# Patient Record
Sex: Male | Born: 1955
Health system: Southern US, Community
[De-identification: ages and names within clinical notes are randomized; demographics above are authoritative.]

## PROBLEM LIST (undated history)

## (undated) DIAGNOSIS — E785 Hyperlipidemia, unspecified: Secondary | ICD-10-CM

## (undated) DIAGNOSIS — E079 Disorder of thyroid, unspecified: Secondary | ICD-10-CM

## (undated) DIAGNOSIS — T7840XA Allergy, unspecified, initial encounter: Secondary | ICD-10-CM

## (undated) DIAGNOSIS — K4090 Unilateral inguinal hernia, without obstruction or gangrene, not specified as recurrent: Secondary | ICD-10-CM

## (undated) DIAGNOSIS — E039 Hypothyroidism, unspecified: Secondary | ICD-10-CM

## (undated) DIAGNOSIS — K219 Gastro-esophageal reflux disease without esophagitis: Secondary | ICD-10-CM

## (undated) HISTORY — DX: Allergy, unspecified, initial encounter: T78.40XA

## (undated) HISTORY — DX: Unilateral inguinal hernia, without obstruction or gangrene, not specified as recurrent: K40.90

## (undated) HISTORY — PX: COLONOSCOPY: SHX174

---

## 2003-12-17 ENCOUNTER — Encounter: Admission: RE | Admit: 2003-12-17 | Discharge: 2004-03-16 | Payer: Self-pay | Admitting: Family Medicine

## 2004-02-16 ENCOUNTER — Ambulatory Visit: Payer: Self-pay | Admitting: Gastroenterology

## 2004-03-08 ENCOUNTER — Other Ambulatory Visit: Admission: RE | Admit: 2004-03-08 | Discharge: 2004-03-08 | Payer: Self-pay | Admitting: Diagnostic Radiology

## 2004-03-14 ENCOUNTER — Ambulatory Visit: Payer: Self-pay | Admitting: Gastroenterology

## 2009-02-08 ENCOUNTER — Encounter: Admission: RE | Admit: 2009-02-08 | Discharge: 2009-02-08 | Payer: Self-pay | Admitting: Family Medicine

## 2010-01-23 ENCOUNTER — Encounter: Admission: RE | Admit: 2010-01-23 | Discharge: 2010-01-23 | Payer: Self-pay | Admitting: Family Medicine

## 2011-02-27 ENCOUNTER — Encounter: Payer: Self-pay | Admitting: Gastroenterology

## 2011-03-01 ENCOUNTER — Other Ambulatory Visit: Payer: Self-pay | Admitting: Family Medicine

## 2011-03-01 DIAGNOSIS — E039 Hypothyroidism, unspecified: Secondary | ICD-10-CM

## 2011-03-07 ENCOUNTER — Ambulatory Visit
Admission: RE | Admit: 2011-03-07 | Discharge: 2011-03-07 | Disposition: A | Discharge status: Home / Self Care | Payer: Managed Care, Other (non HMO) | Source: Ambulatory Visit | Attending: Family Medicine | Admitting: Family Medicine

## 2011-03-07 DIAGNOSIS — E039 Hypothyroidism, unspecified: Secondary | ICD-10-CM

## 2011-12-10 ENCOUNTER — Encounter: Payer: Self-pay | Admitting: Gastroenterology

## 2013-03-18 ENCOUNTER — Other Ambulatory Visit: Payer: Self-pay | Admitting: Family Medicine

## 2013-03-18 DIAGNOSIS — E039 Hypothyroidism, unspecified: Secondary | ICD-10-CM

## 2013-03-19 ENCOUNTER — Ambulatory Visit
Admission: RE | Admit: 2013-03-19 | Discharge: 2013-03-19 | Disposition: A | Discharge status: Home / Self Care | Payer: Managed Care, Other (non HMO) | Source: Ambulatory Visit | Attending: Family Medicine | Admitting: Family Medicine

## 2013-03-19 DIAGNOSIS — E039 Hypothyroidism, unspecified: Secondary | ICD-10-CM

## 2014-08-18 ENCOUNTER — Emergency Department (HOSPITAL_COMMUNITY): Payer: BLUE CROSS/BLUE SHIELD

## 2014-08-18 ENCOUNTER — Encounter (HOSPITAL_COMMUNITY): Payer: Self-pay | Admitting: *Deleted

## 2014-08-18 ENCOUNTER — Inpatient Hospital Stay (HOSPITAL_COMMUNITY)
Admission: EM | Admit: 2014-08-18 | Discharge: 2014-08-26 | DRG: 184 | Disposition: A | Discharge status: Home / Self Care | Payer: BLUE CROSS/BLUE SHIELD | Attending: General Surgery | Admitting: General Surgery

## 2014-08-18 DIAGNOSIS — S2221XA Fracture of manubrium, initial encounter for closed fracture: Secondary | ICD-10-CM | POA: Diagnosis not present

## 2014-08-18 DIAGNOSIS — R140 Abdominal distension (gaseous): Secondary | ICD-10-CM

## 2014-08-18 DIAGNOSIS — Y92007 Garden or yard of unspecified non-institutional (private) residence as the place of occurrence of the external cause: Secondary | ICD-10-CM

## 2014-08-18 DIAGNOSIS — R55 Syncope and collapse: Secondary | ICD-10-CM

## 2014-08-18 DIAGNOSIS — D62 Acute posthemorrhagic anemia: Secondary | ICD-10-CM | POA: Diagnosis not present

## 2014-08-18 DIAGNOSIS — E785 Hyperlipidemia, unspecified: Secondary | ICD-10-CM | POA: Diagnosis present

## 2014-08-18 DIAGNOSIS — W19XXXA Unspecified fall, initial encounter: Secondary | ICD-10-CM

## 2014-08-18 DIAGNOSIS — Z79899 Other long term (current) drug therapy: Secondary | ICD-10-CM

## 2014-08-18 DIAGNOSIS — M79604 Pain in right leg: Secondary | ICD-10-CM

## 2014-08-18 DIAGNOSIS — S8011XA Contusion of right lower leg, initial encounter: Secondary | ICD-10-CM

## 2014-08-18 DIAGNOSIS — Z791 Long term (current) use of non-steroidal anti-inflammatories (NSAID): Secondary | ICD-10-CM

## 2014-08-18 DIAGNOSIS — K219 Gastro-esophageal reflux disease without esophagitis: Secondary | ICD-10-CM | POA: Diagnosis present

## 2014-08-18 DIAGNOSIS — S7010XA Contusion of unspecified thigh, initial encounter: Secondary | ICD-10-CM | POA: Diagnosis present

## 2014-08-18 DIAGNOSIS — S2231XA Fracture of one rib, right side, initial encounter for closed fracture: Secondary | ICD-10-CM

## 2014-08-18 DIAGNOSIS — T07XXXA Unspecified multiple injuries, initial encounter: Secondary | ICD-10-CM

## 2014-08-18 DIAGNOSIS — S2220XA Unspecified fracture of sternum, initial encounter for closed fracture: Secondary | ICD-10-CM | POA: Diagnosis present

## 2014-08-18 DIAGNOSIS — S5001XA Contusion of right elbow, initial encounter: Secondary | ICD-10-CM

## 2014-08-18 DIAGNOSIS — M79651 Pain in right thigh: Secondary | ICD-10-CM | POA: Diagnosis present

## 2014-08-18 DIAGNOSIS — W11XXXA Fall on and from ladder, initial encounter: Secondary | ICD-10-CM | POA: Diagnosis present

## 2014-08-18 DIAGNOSIS — S50311A Abrasion of right elbow, initial encounter: Secondary | ICD-10-CM

## 2014-08-18 DIAGNOSIS — S70311A Abrasion, right thigh, initial encounter: Secondary | ICD-10-CM | POA: Diagnosis present

## 2014-08-18 DIAGNOSIS — S27892A Contusion of other specified intrathoracic organs, initial encounter: Secondary | ICD-10-CM | POA: Diagnosis present

## 2014-08-18 DIAGNOSIS — K567 Ileus, unspecified: Secondary | ICD-10-CM

## 2014-08-18 HISTORY — DX: Disorder of thyroid, unspecified: E07.9

## 2014-08-18 HISTORY — DX: Hyperlipidemia, unspecified: E78.5

## 2014-08-18 HISTORY — DX: Gastro-esophageal reflux disease without esophagitis: K21.9

## 2014-08-18 LAB — COMPREHENSIVE METABOLIC PANEL
ALT: 25 U/L (ref 17–63)
AST: 34 U/L (ref 15–41)
Albumin: 3.8 g/dL (ref 3.5–5.0)
Alkaline Phosphatase: 44 U/L (ref 38–126)
Anion gap: 11 (ref 5–15)
BILIRUBIN TOTAL: 0.9 mg/dL (ref 0.3–1.2)
BUN: 13 mg/dL (ref 6–20)
CALCIUM: 8.6 mg/dL — AB (ref 8.9–10.3)
CO2: 20 mmol/L — AB (ref 22–32)
CREATININE: 0.97 mg/dL (ref 0.61–1.24)
Chloride: 106 mmol/L (ref 101–111)
GFR calc non Af Amer: >60 mL/min (ref >60)
GLUCOSE: 102 mg/dL — AB (ref 65–99)
POTASSIUM: 4 mmol/L (ref 3.5–5.1)
SODIUM: 137 mmol/L (ref 135–145)
Total Protein: 6.7 g/dL (ref 6.5–8.1)

## 2014-08-18 LAB — URINALYSIS, ROUTINE W REFLEX MICROSCOPIC
Bilirubin Urine: NEGATIVE
GLUCOSE, UA: NEGATIVE mg/dL
HGB URINE DIPSTICK: NEGATIVE
KETONES UR: 40 mg/dL — AB
Leukocytes, UA: NEGATIVE
NITRITE: NEGATIVE
PH: 5 (ref 5.0–8.0)
Protein, ur: NEGATIVE mg/dL
Specific Gravity, Urine: 1.028 (ref 1.005–1.030)
UROBILINOGEN UA: 0.2 mg/dL (ref 0.0–1.0)

## 2014-08-18 LAB — CBC WITH DIFFERENTIAL/PLATELET
BASOS ABS: 0 10*3/uL (ref 0.0–0.1)
BASOS PCT: 0 % (ref 0–1)
EOS ABS: 0 10*3/uL (ref 0.0–0.7)
Eosinophils Relative: 0 % (ref 0–5)
HCT: 43.1 % (ref 39.0–52.0)
Hemoglobin: 14.7 g/dL (ref 13.0–17.0)
LYMPHS ABS: 0.9 10*3/uL (ref 0.7–4.0)
Lymphocytes Relative: 8 % — ABNORMAL LOW (ref 12–46)
MCH: 31.1 pg (ref 26.0–34.0)
MCHC: 34.1 g/dL (ref 30.0–36.0)
MCV: 91.1 fL (ref 78.0–100.0)
MONOS PCT: 8 % (ref 3–12)
Monocytes Absolute: 0.9 10*3/uL (ref 0.1–1.0)
Neutro Abs: 9.1 10*3/uL — ABNORMAL HIGH (ref 1.7–7.7)
Neutrophils Relative %: 84 % — ABNORMAL HIGH (ref 43–77)
PLATELETS: 173 10*3/uL (ref 150–400)
RBC: 4.73 MIL/uL (ref 4.22–5.81)
RDW: 13.5 % (ref 11.5–15.5)
WBC: 10.9 10*3/uL — AB (ref 4.0–10.5)

## 2014-08-18 LAB — HEMOGLOBIN AND HEMATOCRIT, BLOOD
HCT: 40.4 % (ref 39.0–52.0)
HEMOGLOBIN: 13.4 g/dL (ref 13.0–17.0)

## 2014-08-18 MED ORDER — ONDANSETRON HCL 4 MG PO TABS: 4.0000 mg | ORAL_TABLET | Freq: Four times a day (QID) | ORAL | Status: DC | PRN

## 2014-08-18 MED ORDER — SIMVASTATIN 40 MG PO TABS
40.0000 mg | ORAL_TABLET | Freq: Every day | ORAL | Status: DC
  Administered 2014-08-19 – 2014-08-25 (×7): 40 mg via ORAL
  Filled 2014-08-18 (×7): qty 1

## 2014-08-18 MED ORDER — FENTANYL CITRATE (PF) 100 MCG/2ML IJ SOLN
100.0000 ug | INTRAMUSCULAR | Status: DC | PRN
  Administered 2014-08-18 (×2): 100 ug via INTRAVENOUS
  Filled 2014-08-18 (×2): qty 2

## 2014-08-18 MED ORDER — HYDROMORPHONE HCL 1 MG/ML IJ SOLN
0.5000 mg | INTRAMUSCULAR | Status: DC | PRN
  Administered 2014-08-18: 0.5 mg via INTRAVENOUS
  Administered 2014-08-19: 1 mg via INTRAVENOUS
  Filled 2014-08-18 (×2): qty 1

## 2014-08-18 MED ORDER — SODIUM CHLORIDE 0.9 % IV SOLN
INTRAVENOUS | Status: DC
  Administered 2014-08-18: 17:00:00 via INTRAVENOUS

## 2014-08-18 MED ORDER — SODIUM CHLORIDE 0.9 % IV SOLN
INTRAVENOUS | Status: DC
  Administered 2014-08-18 – 2014-08-19 (×2): via INTRAVENOUS

## 2014-08-18 MED ORDER — OXYCODONE HCL 5 MG PO TABS
10.0000 mg | ORAL_TABLET | ORAL | Status: DC | PRN
  Administered 2014-08-19: 10 mg via ORAL
  Filled 2014-08-18: qty 2

## 2014-08-18 MED ORDER — OXYCODONE HCL 5 MG PO TABS
5.0000 mg | ORAL_TABLET | ORAL | Status: DC | PRN
  Administered 2014-08-18 – 2014-08-19 (×2): 5 mg via ORAL
  Filled 2014-08-18 (×3): qty 1

## 2014-08-18 MED ORDER — ONDANSETRON HCL 4 MG/2ML IJ SOLN: 4.0000 mg | Freq: Four times a day (QID) | INTRAMUSCULAR | Status: DC | PRN

## 2014-08-18 MED ORDER — DOCUSATE SODIUM 100 MG PO CAPS
100.0000 mg | ORAL_CAPSULE | Freq: Two times a day (BID) | ORAL | Status: DC
  Administered 2014-08-18 – 2014-08-26 (×16): 100 mg via ORAL
  Filled 2014-08-18 (×18): qty 1

## 2014-08-18 MED ORDER — ONDANSETRON HCL 4 MG/2ML IJ SOLN
4.0000 mg | Freq: Once | INTRAMUSCULAR | Status: AC
  Administered 2014-08-18: 4 mg via INTRAVENOUS
  Filled 2014-08-18: qty 2

## 2014-08-18 MED ORDER — FENTANYL CITRATE (PF) 100 MCG/2ML IJ SOLN
50.0000 ug | INTRAMUSCULAR | Status: DC | PRN
  Administered 2014-08-18 (×2): 50 ug via INTRAVENOUS
  Filled 2014-08-18: qty 2

## 2014-08-18 MED ORDER — SODIUM CHLORIDE 0.9 % IV BOLUS (SEPSIS)
500.0000 mL | Freq: Once | INTRAVENOUS | Status: AC
  Administered 2014-08-18: 500 mL via INTRAVENOUS

## 2014-08-18 MED ORDER — BISACODYL 10 MG RE SUPP
10.0000 mg | Freq: Every day | RECTAL | Status: DC | PRN
  Administered 2014-08-21 – 2014-08-23 (×2): 10 mg via RECTAL
  Filled 2014-08-18 (×4): qty 1

## 2014-08-18 MED ORDER — LEVOTHYROXINE SODIUM 100 MCG PO TABS
100.0000 ug | ORAL_TABLET | Freq: Every day | ORAL | Status: DC
  Administered 2014-08-19 – 2014-08-26 (×8): 100 ug via ORAL
  Filled 2014-08-18 (×8): qty 1

## 2014-08-18 MED ORDER — TETANUS-DIPHTH-ACELL PERTUSSIS 5-2.5-18.5 LF-MCG/0.5 IM SUSP
0.5000 mL | Freq: Once | INTRAMUSCULAR | Status: AC
  Administered 2014-08-18: 0.5 mL via INTRAMUSCULAR
  Filled 2014-08-18: qty 0.5

## 2014-08-18 MED ORDER — IOHEXOL 300 MG/ML  SOLN
100.0000 mL | Freq: Once | INTRAMUSCULAR | Status: AC | PRN
  Administered 2014-08-18: 100 mL via INTRAVENOUS

## 2014-08-18 MED ORDER — PANTOPRAZOLE SODIUM 40 MG PO TBEC
40.0000 mg | DELAYED_RELEASE_TABLET | Freq: Every day | ORAL | Status: DC
  Administered 2014-08-19 – 2014-08-26 (×8): 40 mg via ORAL
  Filled 2014-08-18 (×9): qty 1

## 2014-08-18 NOTE — ED Notes (Signed)
Pt unable to urinate at this time, will re attempt in 30 mins.

## 2014-08-18 NOTE — ED Notes (Signed)
Family at bedside. 

## 2014-08-18 NOTE — ED Notes (Signed)
MD at bedside. 

## 2014-08-18 NOTE — ED Notes (Signed)
Patient transported to X-ray 

## 2014-08-18 NOTE — ED Notes (Signed)
Meal given per MD Hulen Skains.

## 2014-08-18 NOTE — ED Notes (Signed)
Pt presents via [facility] after falling 52ft off a ladder onto a AC unit.  R hip abrasions and r elbow abrasions noted, no obvious deformities.  Pt fully immobolized on arrival, c collar and back board.  GCS 15, A x 4, moves all extremities, no numbness or tingling.  C/o Right sided rib pain and right thigh pain.  Lung sounds clear and equal.  BP-98 palp, P-60 reg O2-100% RA.  A x 4, answers questions appropriately.  NAD.

## 2014-08-18 NOTE — ED Notes (Signed)
C-collar removed by MD Eulis Foster.

## 2014-08-18 NOTE — ED Notes (Signed)
Patient transported to CT 

## 2014-08-18 NOTE — ED Notes (Signed)
Attempted report x1. 

## 2014-08-18 NOTE — H&P (Signed)
History   Kenneth Leach is an 59 y.o. male.   Chief Complaint:  Chief Complaint  Patient presents with  . Fall    HPI Comments: Patient has no life-threatening injury.  Some mediastinal blood in the right anterior chest area likely associated with right 2nd rib fracture and maybe an occult manubrial fracture.  Fall Associated symptoms include chest pain.  Trauma Mechanism of injury: fall Injury location: torso Injury location detail: R chest Incident location: home Time since incident: 7 hours   Fall:      Fall occurred: from a ladder      Height of fall: 8 feet      Point of impact: front      Entrapped after fall: no  Protective equipment:       None      Suspicion of alcohol use: no      Suspicion of drug use: no  EMS/PTA data:      Bystander interventions: none      Ambulatory at scene: no      Blood loss: none      Responsiveness: alert      Oriented to: person, place, situation and time      Loss of consciousness: no      Amnesic to event: no      Airway interventions: none      Breathing interventions: none      IV access: established      IO access: none      Fluids administered: normal saline      Cardiac interventions: none      Medications administered: none      Immobilization: none      Airway condition since incident: stable      Breathing condition since incident: stable      Circulation condition since incident: stable      Mental status condition since incident: stable  Current symptoms:      Pain scale: 5/10      Pain quality: aching, heavy and sharp      Associated symptoms:            Reports chest pain.            Denies loss of consciousness.   Relevant PMH:      The patient has not been admitted to the hospital due to injury in the past year, and has not been treated and released from the ED due to injury in the past year.   Past Medical History  Diagnosis Date  . Thyroid disease   . Hyperlipidemia   . GERD (gastroesophageal  reflux disease)     History reviewed. No pertinent past surgical history.  No family history on file. Social History:  reports that he has never smoked. He does not have any smokeless tobacco history on file. He reports that he drinks alcohol. He reports that he does not use illicit drugs.  Allergies  No Known Allergies  Home Medications   (Not in a hospital admission)  Trauma Course   Results for orders placed or performed during the hospital encounter of 08/18/14 (from the past 48 hour(s))  Comprehensive metabolic panel     Status: Abnormal   Collection Time: 08/18/14  4:38 PM  Result Value Ref Range   Sodium 137 135 - 145 mmol/L   Potassium 4.0 3.5 - 5.1 mmol/L   Chloride 106 101 - 111 mmol/L   CO2 20 (L) 22 - 32 mmol/L   Glucose, Bld  102 (H) 65 - 99 mg/dL   BUN 13 6 - 20 mg/dL   Creatinine, Ser 0.97 0.61 - 1.24 mg/dL   Calcium 8.6 (L) 8.9 - 10.3 mg/dL   Total Protein 6.7 6.5 - 8.1 g/dL   Albumin 3.8 3.5 - 5.0 g/dL   AST 34 15 - 41 U/L   ALT 25 17 - 63 U/L   Alkaline Phosphatase 44 38 - 126 U/L   Total Bilirubin 0.9 0.3 - 1.2 mg/dL   GFR calc non Af Amer >60 >60 mL/min   GFR calc Af Amer >60 >60 mL/min    Comment: (NOTE) The eGFR has been calculated using the CKD EPI equation. This calculation has not been validated in all clinical situations. eGFR's persistently <60 mL/min signify possible Chronic Kidney Disease.    Anion gap 11 5 - 15  CBC with Differential     Status: Abnormal   Collection Time: 08/18/14  4:38 PM  Result Value Ref Range   WBC 10.9 (H) 4.0 - 10.5 K/uL   RBC 4.73 4.22 - 5.81 MIL/uL   Hemoglobin 14.7 13.0 - 17.0 g/dL   HCT 43.1 39.0 - 52.0 %   MCV 91.1 78.0 - 100.0 fL   MCH 31.1 26.0 - 34.0 pg   MCHC 34.1 30.0 - 36.0 g/dL   RDW 13.5 11.5 - 15.5 %   Platelets 173 150 - 400 K/uL   Neutrophils Relative % 84 (H) 43 - 77 %   Neutro Abs 9.1 (H) 1.7 - 7.7 K/uL   Lymphocytes Relative 8 (L) 12 - 46 %   Lymphs Abs 0.9 0.7 - 4.0 K/uL   Monocytes  Relative 8 3 - 12 %   Monocytes Absolute 0.9 0.1 - 1.0 K/uL   Eosinophils Relative 0 0 - 5 %   Eosinophils Absolute 0.0 0.0 - 0.7 K/uL   Basophils Relative 0 0 - 1 %   Basophils Absolute 0.0 0.0 - 0.1 K/uL  Urinalysis, Routine w reflex microscopic (not at [facility])     Status: Abnormal   Collection Time: 08/18/14  6:29 PM  Result Value Ref Range   Color, Urine YELLOW YELLOW   APPearance CLEAR CLEAR   Specific Gravity, Urine 1.028 1.005 - 1.030   pH 5.0 5.0 - 8.0   Glucose, UA NEGATIVE NEGATIVE mg/dL   Hgb urine dipstick NEGATIVE NEGATIVE   Bilirubin Urine NEGATIVE NEGATIVE   Ketones, ur 40 (A) NEGATIVE mg/dL   Protein, ur NEGATIVE NEGATIVE mg/dL   Urobilinogen, UA 0.2 0.0 - 1.0 mg/dL   Nitrite NEGATIVE NEGATIVE   Leukocytes, UA NEGATIVE NEGATIVE    Comment: MICROSCOPIC NOT DONE ON URINES WITH NEGATIVE PROTEIN, BLOOD, LEUKOCYTES, NITRITE, OR GLUCOSE <1000 mg/dL.   Dg Ribs Unilateral W/chest Right  08/18/2014   CLINICAL DATA:  [place] [address] 15 feet from ladder 2 hours ago.  EXAM: RIGHT RIBS AND CHEST - 3+ VIEW  COMPARISON:  None.  FINDINGS: There is cardiomegaly. Low lung volumes with bibasilar opacities, likely atelectasis. No effusions or pneumothorax. Prominence of the superior mediastinum with deviation of the trachea to the right. This could be related to substernal goiter or vasculature. Cannot exclude aortic aneurysm or other mediastinal mass.  There is a nondisplaced right lateral tenth rib fracture.  IMPRESSION: Nondisplaced right lateral tenth rib fracture. Low lung volumes with bibasilar atelectasis. No pneumothorax.  Cardiomegaly. Prominence of the superior mediastinum with deviation of the trachea to the right could be related to aortic aneurysm, substernal thyroid goiter, or other mediastinal  mass.   Electronically Signed   By: Rolm Baptise M.D.   On: 08/18/2014 12:46   Dg Elbow Complete Right  08/18/2014   CLINICAL DATA:  59 year old male fell 15 feet off of a ladder 2 hours ago with pain.  Initial encounter.  EXAM: RIGHT ELBOW - COMPLETE 3+ VIEW  COMPARISON:  None.  FINDINGS: IV access artifact. The lateral view is oblique. No right elbow joint effusion is evident. Joint spaces and alignment are within normal limits. The radial head appears intact. No acute fracture or dislocation identified.  IMPRESSION: No acute fracture or dislocation identified about the right elbow.   Electronically Signed   By: Genevie Ann M.D.   On: 08/18/2014 12:42   Ct Head Wo Contrast  08/18/2014   CLINICAL DATA:  Fall. 15 foot fall from ladder. Cervical Spine immobilized.  EXAM: CT HEAD WITHOUT CONTRAST  CT CERVICAL SPINE WITHOUT CONTRAST  TECHNIQUE: Multidetector CT imaging of the head and cervical spine was performed following the standard protocol without intravenous contrast. Multiplanar CT image reconstructions of the cervical spine were also generated.  COMPARISON:  None.  FINDINGS: CT HEAD FINDINGS  No mass lesion, mass effect, midline shift, hydrocephalus, hemorrhage. No territorial ischemia or acute infarction. The calvarium is intact. Tiny RIGHT mastoid effusion is present. Skull base appears intact. Visible paranasal sinuses are normal.  CT CERVICAL SPINE FINDINGS  Alignment: Normal.  Craniocervical junction: Odontoid intact. Occipital condyles are normal. The C1 ring is normal.  Vertebrae: Negative for fracture. Degenerative endplate changes from C5 through C7.  Paraspinal soft tissues: Normal.  Lung apices: Ground-glass attenuation in the lung apices. Given the distribution, this may represent edema or atelectasis. Correlating with the chest radiograph, this probably represents edema given the cardiomegaly.  Severe disc degeneration at C5-C6 and C6-C7 with endplate sclerosis and uncovertebral spurring. Foraminal stenosis is most pronounced at C5-C6.  There is a small cortical defect in the anterior sternal manubrium on the axial images. This does not extend through the posterior cortex and probably represents a  vascular channel rather than sternal fracture. Clinically correlate for tenderness of the sternal manubrium.  IMPRESSION: 1. Negative CT head. 2. Moderate mid to lower cervical spondylosis. No cervical spine fracture or dislocation. 3. Ground-glass attenuation at the lung apices likely representing combination of atelectasis and edema. 4. Tiny anterior cortical lucency in the sternal manubrium, extending from RIGHT to LEFT as it extends superiorly. Vascular channel favored over fracture. Correlate for tenderness.   Electronically Signed   By: Dereck Ligas M.D.   On: 08/18/2014 13:30   Ct Chest W Contrast  08/18/2014   CLINICAL DATA:  15 foot fall from ladder onto air conditioner. Right hip abrasion.  EXAM: CT CHEST, ABDOMEN, AND PELVIS WITH CONTRAST  TECHNIQUE: Multidetector CT imaging of the chest, abdomen and pelvis was performed following the standard protocol during bolus administration of intravenous contrast.  CONTRAST:  156m OMNIPAQUE IOHEXOL 300 MG/ML  SOLN  COMPARISON:  None.  FINDINGS: CT CHEST FINDINGS  Mediastinum/Nodes: There is no contour abnormality of the ascending, transverse, or descending thoracic aorta to suggest dissection or transsection. Great vessels normal.  There is a small amount of hemorrhage within the anterior mediastinum on the right (image 32, series 2). This is seen on coronal image 45, series 5 additionally. This mediastinum collection of blood measures 3.5 x 1.4 cm on image 32, series 2.  There is a fracture of the anterior second rib which is nondisplaced (image 30, series 2). This  is in the vicinity of the mediastinal hemorrhage. There is a probable nondisplaced fracture of the right aspect of the manubrium (image 29, series 3). Also in the vicinity of the mediastinal hemorrhage.  Lungs/Pleura: No pneumothorax. There is bibasilar atelectasis. No pulmonary contusion or pleural fluid. Mild atelectasis along descending aorta.  CT ABDOMEN AND PELVIS FINDINGS  Hepatobiliary:  Left hepatic lobe measures 3 mm is too small to characterize. No evidence of hepatic the bladder is intact without evidence of injury. Delayed imaging through the kidneys demonstrates no collecting system injury.  .  Stomach/Bowel: Stomach, small bowel, appendix, and cecum are normal. The colon and rectosigmoid colon are normal.  Vascular/Lymphatic: Abdominal aorta is normal caliber. There is no retroperitoneal or periportal lymphadenopathy. No pelvic lymphadenopathy.  Reproductive: Normal prostate small right inguinal hernia.  Musculoskeletal: No evidence of pelvic fracture. No evidence of spine fracture.  Other: No free fluid in the abdomen.  IMPRESSION: Chest Impression:  1. Small amount of mediastinal hematoma likely relates to venous hemorrhage from associated nondisplaced manubrial fracture and right anterior second rib fracture. Recommend close observation and if patient develops chest pain or changes in hemodynamics, recommend gated CTA of the thorax to evaluate aorta. 2. Currently no evidence of aortic injury. 3. No pneumothorax. 4. Right second rib fracture and nondisplaced manubrial fracture as above.  Abdomen / Pelvis Impression:  1. No evidence solid organ injury in the abdomen pelvis. 2. No evidence of this pelvic fracture or spine fracture. Findings conveyed toBeaton, MDon 08/18/2014  at18:28.   Electronically Signed   By: Suzy Bouchard M.D.   On: 08/18/2014 18:30   Ct Cervical Spine Wo Contrast  08/18/2014   CLINICAL DATA:  Fall. 15 foot fall from ladder. Cervical Spine immobilized.  EXAM: CT HEAD WITHOUT CONTRAST  CT CERVICAL SPINE WITHOUT CONTRAST  TECHNIQUE: Multidetector CT imaging of the head and cervical spine was performed following the standard protocol without intravenous contrast. Multiplanar CT image reconstructions of the cervical spine were also generated.  COMPARISON:  None.  FINDINGS: CT HEAD FINDINGS  No mass lesion, mass effect, midline shift, hydrocephalus, hemorrhage. No  territorial ischemia or acute infarction. The calvarium is intact. Tiny RIGHT mastoid effusion is present. Skull base appears intact. Visible paranasal sinuses are normal.  CT CERVICAL SPINE FINDINGS  Alignment: Normal.  Craniocervical junction: Odontoid intact. Occipital condyles are normal. The C1 ring is normal.  Vertebrae: Negative for fracture. Degenerative endplate changes from C5 through C7.  Paraspinal soft tissues: Normal.  Lung apices: Ground-glass attenuation in the lung apices. Given the distribution, this may represent edema or atelectasis. Correlating with the chest radiograph, this probably represents edema given the cardiomegaly.  Severe disc degeneration at C5-C6 and C6-C7 with endplate sclerosis and uncovertebral spurring. Foraminal stenosis is most pronounced at C5-C6.  There is a small cortical defect in the anterior sternal manubrium on the axial images. This does not extend through the posterior cortex and probably represents a vascular channel rather than sternal fracture. Clinically correlate for tenderness of the sternal manubrium.  IMPRESSION: 1. Negative CT head. 2. Moderate mid to lower cervical spondylosis. No cervical spine fracture or dislocation. 3. Ground-glass attenuation at the lung apices likely representing combination of atelectasis and edema. 4. Tiny anterior cortical lucency in the sternal manubrium, extending from RIGHT to LEFT as it extends superiorly. Vascular channel favored over fracture. Correlate for tenderness.   Electronically Signed   By: Dereck Ligas M.D.   On: 08/18/2014 13:30   Ct Abdomen Pelvis  W Contrast  08/18/2014   CLINICAL DATA:  15 foot fall from ladder onto air conditioner. Right hip abrasion.  EXAM: CT CHEST, ABDOMEN, AND PELVIS WITH CONTRAST  TECHNIQUE: Multidetector CT imaging of the chest, abdomen and pelvis was performed following the standard protocol during bolus administration of intravenous contrast.  CONTRAST:  173m OMNIPAQUE IOHEXOL 300  MG/ML  SOLN  COMPARISON:  None.  FINDINGS: CT CHEST FINDINGS  Mediastinum/Nodes: There is no contour abnormality of the ascending, transverse, or descending thoracic aorta to suggest dissection or transsection. Great vessels normal.  There is a small amount of hemorrhage within the anterior mediastinum on the right (image 32, series 2). This is seen on coronal image 45, series 5 additionally. This mediastinum collection of blood measures 3.5 x 1.4 cm on image 32, series 2.  There is a fracture of the anterior second rib which is nondisplaced (image 30, series 2). This is in the vicinity of the mediastinal hemorrhage. There is a probable nondisplaced fracture of the right aspect of the manubrium (image 29, series 3). Also in the vicinity of the mediastinal hemorrhage.  Lungs/Pleura: No pneumothorax. There is bibasilar atelectasis. No pulmonary contusion or pleural fluid. Mild atelectasis along descending aorta.  CT ABDOMEN AND PELVIS FINDINGS  Hepatobiliary: Left hepatic lobe measures 3 mm is too small to characterize. No evidence of hepatic the bladder is intact without evidence of injury. Delayed imaging through the kidneys demonstrates no collecting system injury.  .  Stomach/Bowel: Stomach, small bowel, appendix, and cecum are normal. The colon and rectosigmoid colon are normal.  Vascular/Lymphatic: Abdominal aorta is normal caliber. There is no retroperitoneal or periportal lymphadenopathy. No pelvic lymphadenopathy.  Reproductive: Normal prostate small right inguinal hernia.  Musculoskeletal: No evidence of pelvic fracture. No evidence of spine fracture.  Other: No free fluid in the abdomen.  IMPRESSION: Chest Impression:  1. Small amount of mediastinal hematoma likely relates to venous hemorrhage from associated nondisplaced manubrial fracture and right anterior second rib fracture. Recommend close observation and if patient develops chest pain or changes in hemodynamics, recommend gated CTA of the thorax to  evaluate aorta. 2. Currently no evidence of aortic injury. 3. No pneumothorax. 4. Right second rib fracture and nondisplaced manubrial fracture as above.  Abdomen / Pelvis Impression:  1. No evidence solid organ injury in the abdomen pelvis. 2. No evidence of this pelvic fracture or spine fracture. Findings conveyed toBeaton, MDon 08/18/2014  at18:28.   Electronically Signed   By: SSuzy BouchardM.D.   On: 08/18/2014 18:30   Dg Femur, Min 2 Views Right  08/18/2014   CLINICAL DATA:  59year old male who fell 15 feet off of a ladder 2 hours ago. Pain. Initial encounter.  EXAM: RIGHT FEMUR 2 VIEWS  COMPARISON:  None.  FINDINGS: Mild spine board artifact. The right femoral head is normally located. The visible right hemipelvis appears intact. The proximal right femur is intact. The right femoral shaft is intact. The distal right femur appears intact with grossly normal alignment at the right knee.  IMPRESSION: No acute fracture or dislocation identified about the right femur.   Electronically Signed   By: HGenevie AnnM.D.   On: 08/18/2014 12:41    Review of Systems  Cardiovascular: Positive for chest pain.  Neurological: Negative for loss of consciousness.    Blood pressure 118/77, pulse 71, temperature 97.8 F (36.6 C), temperature source Oral, resp. rate 16, SpO2 95 %. Physical Exam  Constitutional: He is oriented to person, place, and  time. He appears well-developed and well-nourished.  HENT:  Head: Normocephalic and atraumatic.  Eyes: Conjunctivae and EOM are normal. Pupils are equal, round, and reactive to light.  Neck: Normal range of motion. Neck supple.  Cardiovascular: Normal rate, regular rhythm and normal heart sounds.   Respiratory: Effort normal and breath sounds normal.    GI: Soft. Bowel sounds are normal.  Musculoskeletal: He exhibits tenderness (right thigh).       Right elbow: He exhibits decreased range of motion and swelling. He exhibits no deformity. Tenderness found.        Right upper leg: He exhibits tenderness and swelling. He exhibits no deformity and no laceration.       Legs: Neurological: He is alert and oriented to person, place, and time. He has normal reflexes.  Skin: Skin is warm and dry.  Psychiatric: He has a normal mood and affect.     Assessment/Plan Fall from ladder with the following injuries:  1.  Right 2nd rib fracture; 2.  Possible manubrial fx with anterior mediastinal blood from venous injury; 3.  Right thigh contusion 4.  Right elbow contusion  Other non-pathological findings:  1.  Widened mediastinum associated with fatty tissue; 2.  Ectatic aorta with tracheal deviation;  Patient to be admitted for pain control and physical therapy--was not able to walk in the ED secondary to pain and became vagal.  Nickey Kloepfer 08/18/2014, 7:18 PM   Procedures

## 2014-08-18 NOTE — Progress Notes (Signed)
Pt arrived to 4N17 from ED. Alert and oriented x 4. Pt complaining of pain after being transfer from the stretcher to the bed. Pt medicated in the ED. Pain decreased once pt settled in bed. Pt oriented to call light. Vitals stable. Will continue to monitor.

## 2014-08-18 NOTE — ED Notes (Signed)
Attempted report x 2 

## 2014-08-18 NOTE — ED Provider Notes (Signed)
CSN: [id number]     Arrival date & time 08/18/14  1033 History   First MD Initiated Contact with Patient 08/18/14 1123     Chief Complaint  Patient presents with  . Fall     (Consider location/radiation/quality/duration/timing/severity/associated sxs/prior Treatment) HPI  Kenneth Leach is a 59 y.o. male who presents for evaluation of injury from fall. He was painting trim on the latter when it slipped, causing him to fall. He thinks his feet were about 8 feet off the ground at the time. He injured his right thigh, right chest and right elbow. He was unable to get up and walk afterwards secondary to pain in his right leg. Presents by EMS fully immobilized. He is removed from backboard, by nursing prior to my seeing the patient. Patient ate breakfast this morning. He denies preceding symptoms in the fall. There was no noted loss of consciousness. There are no other known modifying factors.   Past Medical History  Diagnosis Date  . Thyroid disease   . Hyperlipidemia   . GERD (gastroesophageal reflux disease)    History reviewed. No pertinent past surgical history. No family history on file. History  Substance Use Topics  . Smoking status: Never Smoker   . Smokeless tobacco: Not on file  . Alcohol Use: Yes    Review of Systems  All other systems reviewed and are negative.     Allergies  Review of patient's allergies indicates no known allergies.  Home Medications   Prior to Admission medications   Medication Sig Start Date End Date Taking? Authorizing Provider  ibuprofen (ADVIL,MOTRIN) 200 MG tablet Take 200-400 mg by mouth every 6 (six) hours as needed for moderate pain.   Yes Historical Provider, MD  omeprazole (PRILOSEC) 20 MG capsule Take 20 mg by mouth daily.   Yes Historical Provider, MD  simvastatin (ZOCOR) 40 MG tablet Take 40 mg by mouth daily. 06/01/14  Yes Historical Provider, MD  SYNTHROID 100 MCG tablet Take 100 mcg by mouth daily. 05/31/14  Yes Historical  Provider, MD   BP 116/73 mmHg  Pulse 71  Temp(Src) 97.8 F (36.6 C) (Oral)  Resp 18  SpO2 94% Physical Exam  Constitutional: He is oriented to person, place, and time. He appears well-developed and well-nourished. He appears distressed (he is uncomfortable).  HENT:  Head: Normocephalic and atraumatic.  Right Ear: External ear normal.  Left Ear: External ear normal.  Eyes: Conjunctivae and EOM are normal. Pupils are equal, round, and reactive to light.  Neck: Normal range of motion and phonation normal. Neck supple.  Cardiovascular: Normal rate, regular rhythm and normal heart sounds.   Pulmonary/Chest: Effort normal and breath sounds normal. He exhibits no bony tenderness.  Abdominal: Soft. There is no tenderness.  Musculoskeletal: Normal range of motion. He exhibits no edema or tenderness.  Tender right mid thigh, without deformity. Associated linear abrasion right lateral thigh. Tender right elbow with mild swelling over the olecranon and small abrasion there. Good range of motion, arms and left leg. Cervical collar removed- no tenderness or step-off along the posterior cervical spine. No thoracic or lumbar spine tenderness. Normal range of motion of the neck, without pain.  Neurological: He is alert and oriented to person, place, and time. No cranial nerve deficit or sensory deficit. He exhibits normal muscle tone. Coordination normal.  Skin: Skin is warm, dry and intact.  Psychiatric: He has a normal mood and affect. His behavior is normal. Judgment and thought content normal.  Nursing note and vitals  reviewed.   ED Course  Procedures (including critical care time) Medications  fentaNYL (SUBLIMAZE) injection 100 mcg (100 mcg Intravenous Given 08/18/14 1536)  sodium chloride 0.9 % bolus 500 mL (not administered)  0.9 %  sodium chloride infusion (not administered)  ondansetron (ZOFRAN) injection 4 mg (4 mg Intravenous Given 08/18/14 1135)  Tdap (BOOSTRIX) injection 0.5 mL (0.5 mLs  Intramuscular Given 08/18/14 1310)    Patient Vitals for the past 24 hrs:  BP Temp Temp src Pulse Resp SpO2  08/18/14 1615 116/73 mmHg - - 71 18 94 %  08/18/14 1611 107/71 mmHg - - 72 19 94 %  08/18/14 1545 117/77 mmHg - - 85 17 92 %  08/18/14 1531 123/77 mmHg - - 76 18 99 %  08/18/14 1517 122/73 mmHg - - 77 20 95 %  08/18/14 1515 122/73 mmHg - - 73 20 95 %  08/18/14 1500 121/80 mmHg - - 71 17 96 %  08/18/14 1430 113/76 mmHg - - 71 17 95 %  08/18/14 1400 116/76 mmHg - - 71 16 95 %  08/18/14 1345 113/73 mmHg - - 69 17 97 %  08/18/14 1315 110/71 mmHg - - 71 12 95 %  08/18/14 1145 112/74 mmHg - - 64 17 92 %  08/18/14 1130 104/55 mmHg - - (!) 56 17 97 %  08/18/14 1115 115/70 mmHg - - (!) 58 20 100 %  08/18/14 1100 112/74 mmHg - - 60 18 97 %  08/18/14 1045 112/73 mmHg - - (!) 56 12 98 %  08/18/14 1040 104/72 mmHg 97.8 F (36.6 C) Oral (!) 56 12 95 %    4:10 PM Reevaluation with update and discussion. After initial assessment and treatment, an updated evaluation reveals an attempt at standing to walk, caused the patient to get syncopal, and have decreased ROM sponsored this as he sat. He was immediately laid back down and had recovery of consciousness, and stated that sitting caused severe pain in his right chest, and right leg. IV fluid bolus given, blood pressure, borderline low. Patient family updated on findings, and anticipated further evaluation. Patient will likely require admission, after additional trauma evaluation. Adamary Savary L   CRITICAL CARE Performed by: Daleen Bo L Total critical care time: 45 minutes Critical care time was exclusive of separately billable procedures and treating other patients. Critical care was necessary to treat or prevent imminent or life-threatening deterioration. Critical care was time spent personally by me on the following activities: development of treatment plan with patient and/or surrogate as well as nursing, discussions with consultants,  evaluation of patient's response to treatment, examination of patient, obtaining history from patient or surrogate, ordering and performing treatments and interventions, ordering and review of laboratory studies, ordering and review of radiographic studies, pulse oximetry and re-evaluation of patient's condition.   Labs Review Labs Reviewed  URINALYSIS, ROUTINE W REFLEX MICROSCOPIC (NOT AT [facility])  COMPREHENSIVE METABOLIC PANEL  CBC WITH DIFFERENTIAL/PLATELET    Imaging Review Dg Ribs Unilateral W/chest Right  08/18/2014   CLINICAL DATA:  Fell 15 feet from ladder 2 hours ago.  EXAM: RIGHT RIBS AND CHEST - 3+ VIEW  COMPARISON:  None.  FINDINGS: There is cardiomegaly. Low lung volumes with bibasilar opacities, likely atelectasis. No effusions or pneumothorax. Prominence of the superior mediastinum with deviation of the trachea to the right. This could be related to substernal goiter or vasculature. Cannot exclude aortic aneurysm or other mediastinal mass.  There is a nondisplaced right lateral tenth rib fracture.  IMPRESSION:  Nondisplaced right lateral tenth rib fracture. Low lung volumes with bibasilar atelectasis. No pneumothorax.  Cardiomegaly. Prominence of the superior mediastinum with deviation of the trachea to the right could be related to aortic aneurysm, substernal thyroid goiter, or other mediastinal mass.   Electronically Signed   By: Rolm Baptise M.D.   On: 08/18/2014 12:46   Dg Elbow Complete Right  08/18/2014   CLINICAL DATA:  59 year old male fell 15 feet off of a ladder 2 hours ago with pain. Initial encounter.  EXAM: RIGHT ELBOW - COMPLETE 3+ VIEW  COMPARISON:  None.  FINDINGS: IV access artifact. The lateral view is oblique. No right elbow joint effusion is evident. Joint spaces and alignment are within normal limits. The radial head appears intact. No acute fracture or dislocation identified.  IMPRESSION: No acute fracture or dislocation identified about the right elbow.   Electronically  Signed   By: Genevie Ann M.D.   On: 08/18/2014 12:42   Ct Head Wo Contrast  08/18/2014   CLINICAL DATA:  Fall. 15 foot fall from ladder. Cervical Spine immobilized.  EXAM: CT HEAD WITHOUT CONTRAST  CT CERVICAL SPINE WITHOUT CONTRAST  TECHNIQUE: Multidetector CT imaging of the head and cervical spine was performed following the standard protocol without intravenous contrast. Multiplanar CT image reconstructions of the cervical spine were also generated.  COMPARISON:  None.  FINDINGS: CT HEAD FINDINGS  No mass lesion, mass effect, midline shift, hydrocephalus, hemorrhage. No territorial ischemia or acute infarction. The calvarium is intact. Tiny RIGHT mastoid effusion is present. Skull base appears intact. Visible paranasal sinuses are normal.  CT CERVICAL SPINE FINDINGS  Alignment: Normal.  Craniocervical junction: Odontoid intact. Occipital condyles are normal. The C1 ring is normal.  Vertebrae: Negative for fracture. Degenerative endplate changes from C5 through C7.  Paraspinal soft tissues: Normal.  Lung apices: Ground-glass attenuation in the lung apices. Given the distribution, this may represent edema or atelectasis. Correlating with the chest radiograph, this probably represents edema given the cardiomegaly.  Severe disc degeneration at C5-C6 and C6-C7 with endplate sclerosis and uncovertebral spurring. Foraminal stenosis is most pronounced at C5-C6.  There is a small cortical defect in the anterior sternal manubrium on the axial images. This does not extend through the posterior cortex and probably represents a vascular channel rather than sternal fracture. Clinically correlate for tenderness of the sternal manubrium.  IMPRESSION: 1. Negative CT head. 2. Moderate mid to lower cervical spondylosis. No cervical spine fracture or dislocation. 3. Ground-glass attenuation at the lung apices likely representing combination of atelectasis and edema. 4. Tiny anterior cortical lucency in the sternal manubrium, extending  from RIGHT to LEFT as it extends superiorly. Vascular channel favored over fracture. Correlate for tenderness.   Electronically Signed   By: Dereck Ligas M.D.   On: 08/18/2014 13:30   Ct Cervical Spine Wo Contrast  08/18/2014   CLINICAL DATA:  Fall. 15 foot fall from ladder. Cervical Spine immobilized.  EXAM: CT HEAD WITHOUT CONTRAST  CT CERVICAL SPINE WITHOUT CONTRAST  TECHNIQUE: Multidetector CT imaging of the head and cervical spine was performed following the standard protocol without intravenous contrast. Multiplanar CT image reconstructions of the cervical spine were also generated.  COMPARISON:  None.  FINDINGS: CT HEAD FINDINGS  No mass lesion, mass effect, midline shift, hydrocephalus, hemorrhage. No territorial ischemia or acute infarction. The calvarium is intact. Tiny RIGHT mastoid effusion is present. Skull base appears intact. Visible paranasal sinuses are normal.  CT CERVICAL SPINE FINDINGS  Alignment: Normal.  Craniocervical junction: Odontoid intact. Occipital condyles are normal. The C1 ring is normal.  Vertebrae: Negative for fracture. Degenerative endplate changes from C5 through C7.  Paraspinal soft tissues: Normal.  Lung apices: Ground-glass attenuation in the lung apices. Given the distribution, this may represent edema or atelectasis. Correlating with the chest radiograph, this probably represents edema given the cardiomegaly.  Severe disc degeneration at C5-C6 and C6-C7 with endplate sclerosis and uncovertebral spurring. Foraminal stenosis is most pronounced at C5-C6.  There is a small cortical defect in the anterior sternal manubrium on the axial images. This does not extend through the posterior cortex and probably represents a vascular channel rather than sternal fracture. Clinically correlate for tenderness of the sternal manubrium.  IMPRESSION: 1. Negative CT head. 2. Moderate mid to lower cervical spondylosis. No cervical spine fracture or dislocation. 3. Ground-glass attenuation  at the lung apices likely representing combination of atelectasis and edema. 4. Tiny anterior cortical lucency in the sternal manubrium, extending from RIGHT to LEFT as it extends superiorly. Vascular channel favored over fracture. Correlate for tenderness.   Electronically Signed   By: Dereck Ligas M.D.   On: 08/18/2014 13:30   Dg Femur, Min 2 Views Right  08/18/2014   CLINICAL DATA:  59 year old male who fell 15 feet off of a ladder 2 hours ago. Pain. Initial encounter.  EXAM: RIGHT FEMUR 2 VIEWS  COMPARISON:  None.  FINDINGS: Mild spine board artifact. The right femoral head is normally located. The visible right hemipelvis appears intact. The proximal right femur is intact. The right femoral shaft is intact. The distal right femur appears intact with grossly normal alignment at the right knee.  IMPRESSION: No acute fracture or dislocation identified about the right femur.   Electronically Signed   By: Genevie Ann M.D.   On: 08/18/2014 12:41     EKG Interpretation   Date/Time:  Wednesday August 18 2014 16:06:31 EDT Ventricular Rate:  67 PR Interval:  163 QRS Duration: 93 QT Interval:  428 QTC Calculation: 452 R Axis:   -28 Text Interpretation:  Sinus rhythm Borderline left axis deviation No old  tracing to compare Confirmed by [facility]  MD, Shanan Mcmiller [phone]) on 08/18/2014  4:09:21 PM      MDM   Final diagnoses:  Leg pain, anterior, right  Right rib fracture, closed, initial encounter  Fall, initial encounter  Contusion of right leg, initial encounter  Contusion of right elbow, initial encounter  Abrasion of right elbow, initial encounter  Vasovagal episode    Mechanical fall, with rib fracture, multiple contusions and abrasions. Vasovagal episode in the ED, required additional evaluation with imaging of the visceral organs.   Nursing Notes Reviewed/ Care Coordinated, and agree without changes. Applicable Imaging Reviewed.  Interpretation of Laboratory Data incorporated into ED  treatment   Plan: Care per oncoming provider team, with possible admission if his symptoms do not improve, following treatment imaging.    Daleen Bo, MD 08/23/14 2007

## 2014-08-18 NOTE — ED Provider Notes (Signed)
CT scan of chest showed mediastinal blood.  Trauma was consulted and came and saw the patient in the department and patient was admitted.  Leonard Schwartz, MD 08/18/14 2029

## 2014-08-19 DIAGNOSIS — W11XXXA Fall on and from ladder, initial encounter: Secondary | ICD-10-CM

## 2014-08-19 DIAGNOSIS — S2231XA Fracture of one rib, right side, initial encounter for closed fracture: Secondary | ICD-10-CM | POA: Diagnosis present

## 2014-08-19 DIAGNOSIS — S2220XA Unspecified fracture of sternum, initial encounter for closed fracture: Secondary | ICD-10-CM | POA: Diagnosis present

## 2014-08-19 LAB — BASIC METABOLIC PANEL
Anion gap: 7 (ref 5–15)
BUN: 8 mg/dL (ref 6–20)
CO2: 24 mmol/L (ref 22–32)
CREATININE: 0.84 mg/dL (ref 0.61–1.24)
Calcium: 8.1 mg/dL — ABNORMAL LOW (ref 8.9–10.3)
Chloride: 105 mmol/L (ref 101–111)
GFR calc non Af Amer: >60 mL/min (ref >60)
GLUCOSE: 110 mg/dL — AB (ref 65–99)
Potassium: 3.6 mmol/L (ref 3.5–5.1)
SODIUM: 136 mmol/L (ref 135–145)

## 2014-08-19 LAB — CBC
HCT: 40.1 % (ref 39.0–52.0)
HEMOGLOBIN: 13.3 g/dL (ref 13.0–17.0)
MCH: 30.1 pg (ref 26.0–34.0)
MCHC: 33.2 g/dL (ref 30.0–36.0)
MCV: 90.7 fL (ref 78.0–100.0)
Platelets: 177 10*3/uL (ref 150–400)
RBC: 4.42 MIL/uL (ref 4.22–5.81)
RDW: 13.6 % (ref 11.5–15.5)
WBC: 8.2 10*3/uL (ref 4.0–10.5)

## 2014-08-19 MED ORDER — OXYCODONE HCL 5 MG PO TABS
10.0000 mg | ORAL_TABLET | ORAL | Status: DC | PRN
  Administered 2014-08-19 (×2): 10 mg via ORAL
  Filled 2014-08-19 (×2): qty 2

## 2014-08-19 MED ORDER — HYDROMORPHONE HCL 1 MG/ML IJ SOLN: 0.5000 mg | INTRAMUSCULAR | Status: DC | PRN

## 2014-08-19 MED ORDER — ENOXAPARIN SODIUM 30 MG/0.3ML ~~LOC~~ SOLN
30.0000 mg | Freq: Two times a day (BID) | SUBCUTANEOUS | Status: DC
  Administered 2014-08-19 – 2014-08-25 (×14): 30 mg via SUBCUTANEOUS
  Filled 2014-08-19 (×14): qty 0.3

## 2014-08-19 MED ORDER — METHOCARBAMOL 750 MG PO TABS
1500.0000 mg | ORAL_TABLET | Freq: Four times a day (QID) | ORAL | Status: DC
  Administered 2014-08-19 – 2014-08-22 (×15): 1500 mg via ORAL
  Filled 2014-08-19 (×15): qty 2

## 2014-08-19 MED ORDER — POLYETHYLENE GLYCOL 3350 17 G PO PACK
17.0000 g | PACK | Freq: Every day | ORAL | Status: DC
Start: 2014-08-19 — End: 2014-08-26
  Administered 2014-08-19 – 2014-08-26 (×8): 17 g via ORAL
  Filled 2014-08-19 (×8): qty 1

## 2014-08-19 MED ORDER — IBUPROFEN 200 MG PO TABS
800.0000 mg | ORAL_TABLET | Freq: Three times a day (TID) | ORAL | Status: DC
  Administered 2014-08-19 – 2014-08-20 (×4): 800 mg via ORAL
  Filled 2014-08-19 (×4): qty 4

## 2014-08-19 MED ORDER — TRAMADOL HCL 50 MG PO TABS
100.0000 mg | ORAL_TABLET | Freq: Four times a day (QID) | ORAL | Status: DC
  Administered 2014-08-19 – 2014-08-25 (×24): 100 mg via ORAL
  Filled 2014-08-19 (×24): qty 2

## 2014-08-19 NOTE — Evaluation (Signed)
Occupational Therapy Evaluation Patient Details Name: Kenneth Leach MRN: [id number] DOB: [birth year] Today's Date: 08/19/2014    History of Present Illness 59 yo male s/p fall from ladder with R thigh hematoma, R 2nd rib fx.    Clinical Impression   PT admitted with s/p fall from ladder with R 2nd rib fx. Pt currently with functional limitiations due to the deficits listed below (see OT problem list). PTA independent with all adls and iadls.  Pt will benefit from skilled OT to increase their independence and safety with adls and balance to allow discharge [place]. Pt with extreme pain and limited this session. OT speaking directly with trauma regarding need for total +2 (A) for transfers and extreme pain. Pt will have a full flight of steps at home to reach bedroom and full bathroom. OT will follow acutely for adl retraining .      Follow Up Recommendations  Home health OT;Supervision/Assistance - 24 hour    Equipment Recommendations  3 in 1 bedside comode;Other (comment) (RW)    Recommendations for Other Services       Precautions / Restrictions Precautions Precautions: Fall      Mobility Bed Mobility Overal bed mobility: +2 for physical assistance;Needs Assistance Bed Mobility: Supine to Sit;Rolling Rolling: +2 for physical assistance;Total assist   Supine to sit: +2 for physical assistance;Total assist;HOB elevated     General bed mobility comments: pt with bed sheet used to log roll onto L side total +2 (A) . pt screaming with discomfort. Pt allowed to rest and pursed lip breathing encouraged. HOB increased to (A) with side to sit transfer. Pt total +3 (A) less 25 % (A). pt static sitting min guard. pt allowed extended time. Pt with bed elevated prior to sit<>stand (A)   Transfers Overall transfer level: Needs assistance Equipment used: Rolling walker (2 wheeled) Transfers: Sit to/from Stand Sit to Stand: From elevated surface;+2 physical assistance;Mod assist          General transfer comment: Pt able to static stand with weight primarily on L LE. pt pivoting on L LE. pt reports "i can't put weight on it"  R LE    Balance Overall balance assessment: Needs assistance Sitting-balance support: Single extremity supported;Feet supported Sitting balance-Leahy Scale: Fair     Standing balance support: Bilateral upper extremity supported;During functional activity Standing balance-Leahy Scale: Poor                              ADL Overall ADL's : Needs assistance/impaired Eating/Feeding: Set up;Sitting Eating/Feeding Details (indicate cue type and reason): requesting fluid only at this time Grooming: Wash/dry face;Set up;Sitting Grooming Details (indicate cue type and reason): pt able to apply cold wash cloth to face without (A)     Lower Body Bathing: Total assistance Lower Body Bathing Details (indicate cue type and reason): based on session pt will be total (A) for LB         Toilet Transfer: +2 for physical assistance;Moderate assistance;Stand-pivot;RW Toilet Transfer Details (indicate cue type and reason): pt able to hold RW with BIL UE with minimal weight on R LE           General ADL Comments: pt with extreme pain with all attempts at mobility. RN pushing IV pain medication prior to any movement. Pt reports extreme pain in R UQ with L knee flexion attempting to bridge buttock. Pt able to abduction L LE but minimal abduction R LE due  to pain. Pt very guarded with all movement. Pt required HOB , sheets and total +3 (A) to attempt bed mobility due to posterior lean with resistance.      Vision     Perception     Praxis      Pertinent Vitals/Pain Pain Assessment: 0-10 Pain Score: 6  Pain Location: r chest Pain Descriptors / Indicators: Discomfort Pain Intervention(s): Monitored during session;Repositioned;Premedicated before session     Hand Dominance Right   Extremity/Trunk Assessment Upper Extremity  Assessment Upper Extremity Assessment: Overall WFL for tasks assessed   Lower Extremity Assessment Lower Extremity Assessment: RLE deficits/detail RLE Deficits / Details: bruising R lateral thigh/ reports pain with static standing   Cervical / Trunk Assessment Cervical / Trunk Assessment: Normal   Communication Communication Communication: No difficulties   Cognition Arousal/Alertness: Awake/alert Behavior During Therapy: WFL for tasks assessed/performed Overall Cognitive Status: Within Functional Limits for tasks assessed                     General Comments       Exercises       Shoulder Instructions      Home Living Family/patient expects to be discharged to:: Private residence Living Arrangements: Spouse/significant other Available Help at Discharge: Family;Available PRN/intermittently (daufghter 59 yo) Type of Home: House Home Access: Stairs to enter     Home Layout: Two level;1/2 bath on main level;Bed/bath upstairs     Bathroom Shower/Tub: Occupational psychologist: Handicapped height (downstairs is standard)     Home Equipment: None   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      Prior Functioning/Environment Level of Independence: Independent             OT Diagnosis: Generalized weakness;Acute pain   OT Problem List: Decreased strength;Decreased activity tolerance;Impaired balance (sitting and/or standing);Decreased safety awareness;Decreased knowledge of use of DME or AE;Decreased knowledge of precautions;Pain;Increased edema   OT Treatment/Interventions: Self-care/ADL training;Therapeutic exercise;DME and/or AE instruction;Therapeutic activities;Patient/family education;Balance training    OT Goals(Current goals can be found in the care plan section) Acute Rehab OT Goals Patient Stated Goal: none stated by patient at this time OT Goal Formulation: With patient/family Time For Goal Achievement: 09/02/14 Potential  to Achieve Goals: Good  OT Frequency: Min 3X/week   Barriers to D/C:            Co-evaluation              End of Session Equipment Utilized During Treatment: Conservation officer, nature Nurse Communication: Mobility status;Precautions;Need for lift equipment  Activity Tolerance: Patient limited by pain Patient left: in chair;with call bell/phone within reach;with chair alarm set;with family/visitor present   Time: 2620-3559 OT Time Calculation (min): 30 min Charges:  OT General Charges $OT Visit: 1 Procedure OT Evaluation $Initial OT Evaluation Tier I: 1 Procedure OT Treatments $Self Care/Home Management : 8-22 mins G-Codes: OT G-codes **NOT FOR INPATIENT CLASS** Functional Assessment Tool Used: clinical judgement Functional Limitation: Self care Self Care Current Status (R4163): At least 80 percent but less than 100 percent impaired, limited or restricted Self Care Goal Status (A4536): At least 80 percent but less than 100 percent impaired, limited or restricted  Peri Maris 08/19/2014, 9:03 AM Pager: [phone]

## 2014-08-19 NOTE — Progress Notes (Signed)
Patient ID: Kenneth Leach, male   DOB: April 20, 1955, 59 y.o.   MRN: [id number]  LOS: 2 days  Subjective: Severe pain, mostly chest but also right hip/thigh   Objective: Vital signs in last 24 hours: Temp:  [97.8 F (36.6 C)-99.5 F (37.5 C)] 98.3 F (36.8 C) (06/09 0709) Pulse Rate:  [56-90] 63 (06/09 0709) Resp:  [12-22] 20 (06/09 0709) BP: (104-127)/(55-81) 111/66 mmHg (06/09 0709) SpO2:  [92 %-100 %] 98 % (06/09 0709) Weight:  [104 kg (229 lb 4.5 oz)] 104 kg (229 lb 4.5 oz) (06/08 2102) Last BM Date: 08/18/14   IS: Not present, not stocked on floor   Laboratory  CBC  Recent Labs  08/18/14 1638 08/18/14 2205 08/19/14 0424  WBC 10.9*  --  8.2  HGB 14.7 13.4 13.3  HCT 43.1 40.4 40.1  PLT 173  --  177   BMET  Recent Labs  08/18/14 1638 08/19/14 0424  NA 137 136  K 4.0 3.6  CL 106 105  CO2 20* 24  GLUCOSE 102* 110*  BUN 13 8  CREATININE 0.97 0.84  CALCIUM 8.6* 8.1*    Physical Exam General appearance: alert and no distress Resp: clear to auscultation bilaterally Cardio: regular rate and rhythm GI: normal findings: bowel sounds normal and soft, non-tender   Assessment/Plan: Fall from ladder Right rib/sternal fxs -- Pulmonary toilet Multiple contusions Multiple medical problems -- Home meds FEN -- Maximize pain control (NSAID, tramadol, muscle relaxer, oral narcs) VTE -- SCD's, start Lovenox Dispo -- PT/OT    Lisette Abu, PA-C Pager: [phone] General Trauma PA Pager: [phone]  08/19/2014

## 2014-08-19 NOTE — Evaluation (Signed)
Physical Therapy Evaluation Patient Details Name: Kenneth Leach MRN: [id number] DOB: 04-11-1955 Today's Date: 08/19/2014   History of Present Illness  59 yo male s/p fall from ladder with R thigh hematoma, R 2nd rib fx.   Clinical Impression  Patient demonstrates deficits in functional mobility as indicated below. Will benefit from continued skilled PT to address deficits and maximize function. Will see as indicated and progress as tolerated. Hopeful patient will improve ambulation and activity tolerance, however, if patient unable to tolerate functional distance, may need to consider wheel chair rental for initial discharge.    Follow Up Recommendations Home health PT;Supervision/Assistance - 24 hour;Supervision for mobility/OOB    Equipment Recommendations  Rolling walker with 5" wheels;Wheelchair (measurements PT) (w/c pending improvements in RLE )    Recommendations for Other Services       Precautions / Restrictions Precautions Precautions: Fall      Mobility  Bed Mobility Overal bed mobility: Needs Assistance Bed Mobility: Rolling;Supine to Sit;Sit to Supine Rolling: Min guard     Sit to supine: Min assist   General bed mobility comments: patient cued for technique to self assist, patient able to perform with minimal assist despite increased pain. Cued for exertional breathing techniques during transition  Transfers Overall transfer level: Needs assistance Equipment used: Rolling walker (2 wheeled) Transfers: Sit to/from Stand Sit to Stand: Min assist         General transfer comment: Patient cued for positioning at edge of chair, cued for hand placement and RLE positioning for comfort. Patient was able to power up with minimal assist for stability. Patient with increased pain during transition but tolerated well.  Ambulation/Gait Ambulation/Gait assistance: Min guard Ambulation Distance (Feet): 6 Feet Assistive device: Rolling walker (2 wheeled) Gait  Pattern/deviations: Step-to pattern;Decreased stride length;Decreased weight shift to right;Shuffle     General Gait Details: patient self limiting wt bearing though RLE, very minimal shuffle step with limited movement, significant increase time required to perform very minimal mobility. Patient cued for sequencing with RW.  Stairs            Wheelchair Mobility    Modified Rankin (Stroke Patients Only)       Balance     Sitting balance-Leahy Scale: Fair     Standing balance support: Bilateral upper extremity supported Standing balance-Leahy Scale: Poor                               Pertinent Vitals/Pain Pain Assessment: 0-10 Pain Score: 3  Pain Location: chest and right leg Pain Descriptors / Indicators: Discomfort Pain Intervention(s): Monitored during session;Repositioned;Premedicated before session    Home Living Family/patient expects to be discharged to:: Private residence Living Arrangements: Spouse/significant other Available Help at Discharge: Family;Available PRN/intermittently (daufghter 59 yo) Type of Home: House Home Access: Stairs to enter Entrance Stairs-Rails: Can reach both Entrance Stairs-Number of Steps: 4 Home Layout: Two level;1/2 bath on main level;Bed/bath upstairs Home Equipment: None 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    Prior Function Level of Independence: Independent               Hand Dominance   Dominant Hand: Right    Extremity/Trunk Assessment               Lower Extremity Assessment: RLE deficits/detail RLE Deficits / Details: bruising R lateral thigh/ reports pain with static standing    Cervical / Trunk Assessment: Normal  Communication  Communication: No difficulties  Cognition Arousal/Alertness: Awake/alert Behavior During Therapy: WFL for tasks assessed/performed Overall Cognitive Status: Within Functional Limits for tasks assessed                       General Comments      Exercises        Assessment/Plan    PT Assessment Patient needs continued PT services  PT Diagnosis Difficulty walking;Abnormality of gait;Acute pain   PT Problem List Decreased strength;Decreased range of motion;Decreased activity tolerance;Decreased balance;Decreased mobility;Decreased coordination;Decreased safety awareness;Pain  PT Treatment Interventions DME instruction;Gait training;Stair training;Functional mobility training;Therapeutic activities;Therapeutic exercise;Balance training;Patient/family education   PT Goals (Current goals can be found in the Care Plan section) Acute Rehab PT Goals Patient Stated Goal: none stated by patient at this time PT Goal Formulation: With patient Time For Goal Achievement: 09/02/14 Potential to Achieve Goals: Good    Frequency Min 3X/week   Barriers to discharge        Co-evaluation               End of Session   Activity Tolerance: Patient limited by fatigue;Patient limited by pain Patient left: in bed;with call bell/phone within reach;with family/visitor present Nurse Communication: Mobility status         Time: 1497-0263 PT Time Calculation (min) (ACUTE ONLY): 19 min   Charges:   PT Evaluation $Initial PT Evaluation Tier I: 1 Procedure     PT G Codes:   PT G-Codes **NOT FOR INPATIENT CLASS** Functional Assessment Tool Used: clinical judgement Functional Limitation: Mobility: Walking and moving around Mobility: Walking and Moving Around Current Status (Z8588): At least 20 percent but less than 40 percent impaired, limited or restricted Mobility: Walking and Moving Around Goal Status [phone]): At least 1 percent but less than 20 percent impaired, limited or restricted    Duncan Dull 08/19/2014, 1:41 PM Alben Deeds, [place] DPT  [phone]

## 2014-08-20 ENCOUNTER — Observation Stay (HOSPITAL_COMMUNITY): Payer: BLUE CROSS/BLUE SHIELD

## 2014-08-20 DIAGNOSIS — K567 Ileus, unspecified: Secondary | ICD-10-CM | POA: Diagnosis not present

## 2014-08-20 MED ORDER — BISACODYL 10 MG RE SUPP
10.0000 mg | Freq: Once | RECTAL | Status: AC
  Administered 2014-08-20: 10 mg via RECTAL
  Filled 2014-08-20: qty 1

## 2014-08-20 MED ORDER — KETOROLAC TROMETHAMINE 15 MG/ML IJ SOLN
15.0000 mg | Freq: Four times a day (QID) | INTRAMUSCULAR | Status: DC
  Administered 2014-08-20 – 2014-08-25 (×20): 15 mg via INTRAVENOUS
  Filled 2014-08-20 (×20): qty 1

## 2014-08-20 MED ORDER — KETOROLAC TROMETHAMINE 30 MG/ML IJ SOLN
30.0000 mg | Freq: Once | INTRAMUSCULAR | Status: AC
  Administered 2014-08-20: 30 mg via INTRAVENOUS
  Filled 2014-08-20: qty 1

## 2014-08-20 MED ORDER — OXYCODONE HCL 5 MG PO TABS
5.0000 mg | ORAL_TABLET | ORAL | Status: DC | PRN
  Administered 2014-08-21: 10 mg via ORAL
  Administered 2014-08-21 – 2014-08-22 (×2): 5 mg via ORAL
  Administered 2014-08-23 (×3): 10 mg via ORAL
  Administered 2014-08-24 – 2014-08-25 (×8): 5 mg via ORAL
  Administered 2014-08-26: 10 mg via ORAL
  Administered 2014-08-26: 5 mg via ORAL
  Filled 2014-08-20 (×2): qty 2
  Filled 2014-08-20 (×12): qty 1
  Filled 2014-08-20 (×4): qty 2

## 2014-08-20 NOTE — Clinical Social Work Note (Signed)
Clinical Social Work Assessment  Patient Details  Name: Kenneth Leach MRN: [id number] Date of Birth: 1955-03-20  Date of referral:  08/20/14               Reason for consult:  Rule-out Psychosocial                Permission sought to share information with:    Permission granted to share information::     Name::        Agency::     Relationship::     Contact Information:     Housing/Transportation Living arrangements for the past 2 months:  Single Family Home Source of Information:  Patient, Spouse Patient Interpreter Needed:  None Criminal Activity/Legal Involvement Pertinent to Current Situation/Hospitalization:  No - Comment as needed Significant Relationships:  Adult Children, Spouse Lives with:    Do you feel safe going back to the place where you live?  Yes Need for family participation in patient care:  Yes (Comment)  Care giving concerns None identified.  Pt will have 24 hour care/supervison at d/c.   Social Worker assessment / plan:  CSW met with pt and his wife to discuss d/c planning.  Pt to return home with his wife and grown daughter providing him with care, as necessary.  Pt's wife explains that her daughter is currently working from home and will be there when Maudie Mercury is at work.  Pt agreeable to [facility], if appropriate.  Pt anxious to go home.  Emotional support offered.  Employment status:  Psychologist, counselling:  Managed Care PT Recommendations:  Home with [place] / Referral to community resources:  SBIRT  Patient/Family's Response to care:  Agreeable to return home with [facility] and family support. Patient/Family's Understanding of and Emotional Response to Diagnosis, Current Treatment, and Prognosis:  Pt staes he's "hanging in there."  Emotional Assessment Appearance:  Appears stated age, Well-Groomed Attitude/Demeanor/Rapport:  Other (pt appropriate and cooperative) Affect (typically observed):  Irritable, Restless Orientation:  Oriented to  Self, Oriented to Place, Oriented to  Time, Oriented to Situation Alcohol / Substance use:  Alcohol Use Psych involvement (Current and /or in the community):  No (Comment)  Discharge Needs  Concerns to be addressed:  No discharge needs identified Readmission within the last 30 days:  No Current discharge risk:  None Barriers to Discharge:  No Barriers Identified   Nafisah Runions M, LCSW 08/20/2014, 10:40 AM

## 2014-08-20 NOTE — Progress Notes (Signed)
Patient ID: Kenneth Leach, male   DOB: [birth year], 59 y.o.   MRN: [id number]  LOS: 3 days  Subjective: Denies N/V but feels a little bloated. Pain medications more effective after change yesterday.   Objective: Vital signs in last 24 hours: Temp:  [97.7 F (36.5 C)-98.8 F (37.1 C)] 98.6 F (37 C) (06/10 0537) Pulse Rate:  [58-64] 62 (06/10 0537) Resp:  [16-20] 18 (06/10 0537) BP: (105-137)/(61-80) 109/68 mmHg (06/10 0537) SpO2:  [95 %-97 %] 95 % (06/10 0537) Last BM Date: 08/18/14   IS: 1226ml   Physical Exam General appearance: alert and no distress Resp: clear to auscultation bilaterally Cardio: regular rate and rhythm GI: normal findings: soft, non-tender and abnormal findings:  tympanic BS and mild distension   Assessment/Plan: Fall from ladder Right rib/sternal fxs -- Pulmonary toilet Multiple contusions Ileus -- Mild, monitor Multiple medical problems -- Home meds FEN -- No issues VTE -- SCD's, Lovenox Dispo -- PT/OT, possibly home this afternoon depending on mobility and GI    Lisette Abu, PA-C Pager: [phone] General Trauma PA Pager: [phone]  08/20/2014

## 2014-08-20 NOTE — Progress Notes (Signed)
SBIRT completed. Pt denies need for OP SA resources or f/u.

## 2014-08-20 NOTE — Progress Notes (Signed)
Physical Therapy Treatment Patient Details Name: Kenneth Leach MRN: [id number] DOB: 1955-06-04 Today's Date: 08/20/2014    History of Present Illness 59 yo male s/p fall from ladder with R thigh hematoma, R 2nd rib fx.     PT Comments    Patient with significant improvements in functional status despite pain this session. Patient tolerated mobility, functional tasks and in room ambulation without need for assist. At this time, anticipate patient will continue to progress well with mobility as pain continues to improve. Will continue to see and progress as tolerated.   Follow Up Recommendations  Supervision/Assistance - 24 hour;Supervision for mobility/OOB     Equipment Recommendations  Rolling walker with 5" wheels    Recommendations for Other Services       Precautions / Restrictions Precautions Precautions: Fall    Mobility  Bed Mobility Overal bed mobility: Needs Assistance Bed Mobility: Rolling;Supine to Sit Rolling: Supervision   Supine to sit: Supervision     General bed mobility comments: no physical assist required, increased time   Transfers Overall transfer level: Needs assistance Equipment used: Rolling walker (2 wheeled) Transfers: Sit to/from Stand Sit to Stand: Min guard         General transfer comment: performed from bed and bedside commode, VCs for hand placement  Ambulation/Gait Ambulation/Gait assistance: Supervision Ambulation Distance (Feet): 40 Feet Assistive device: Rolling walker (2 wheeled) Gait Pattern/deviations: Step-through pattern;Decreased stride length Gait velocity: decreased Gait velocity interpretation: Below normal speed for age/gender General Gait Details: no physical assist needed, ambulating in room without difficulty despite pain.    Stairs            Wheelchair Mobility    Modified Rankin (Stroke Patients Only)       Balance Overall balance assessment: Needs assistance   Sitting balance-Leahy Scale:  Fair     Standing balance support: No upper extremity supported;During functional activity Standing balance-Leahy Scale: Fair Standing balance comment: able to perform hygiene and adjust LB clothing without physical assist in standing without use of UE support                    Cognition Arousal/Alertness: Awake/alert Behavior During Therapy: WFL for tasks assessed/performed Overall Cognitive Status: Within Functional Limits for tasks assessed                      Exercises      General Comments        Pertinent Vitals/Pain Pain Assessment: 0-10 Pain Score: 5  Pain Location: right side and right leg Pain Descriptors / Indicators: Discomfort Pain Intervention(s): Monitored during session;Premedicated before session;Repositioned    Home Living                      Prior Function            PT Goals (current goals can now be found in the care plan section) Acute Rehab PT Goals Patient Stated Goal: to get up PT Goal Formulation: With patient Time For Goal Achievement: 09/02/14 Potential to Achieve Goals: Good Progress towards PT goals: Progressing toward goals    Frequency  Min 3X/week    PT Plan Current plan remains appropriate    Co-evaluation             End of Session   Activity Tolerance: No increased pain Patient left: in chair;with call bell/phone within reach;with family/visitor present     Time: 5643-3295 PT Time Calculation (min) (ACUTE ONLY):  16 min  Charges:  $Therapeutic Activity: 8-22 mins                    G CodesDuncan Dull 08-29-14, 3:09 PM Alben Deeds, [place] DPT  [phone]

## 2014-08-20 NOTE — Progress Notes (Signed)
Occupational Therapy Treatment Patient Details Name: Kenneth Leach MRN: [id number] DOB: [birth year] Today's Date: 08/20/2014    History of present illness 59 yo male s/p fall from ladder with R thigh hematoma, R 2nd rib fx.    OT comments  Pt is now at an adequate level from OT standpoint for home. Pt will need DME prior to home. Pt completed full adl in bathroom this session. Pt noted to have blister L inferior angle of scapula area, increased bruising on R flank and hip areas.    Follow Up Recommendations  Supervision - Intermittent;No OT follow up    Equipment Recommendations  3 in 1 bedside comode;Other (comment) (RW)    Recommendations for Other Services      Precautions / Restrictions Precautions Precautions: Fall       Mobility Bed Mobility Overal bed mobility: Needs Assistance Bed Mobility: Rolling;Supine to Sit Rolling: Supervision   Supine to sit: Supervision     General bed mobility comments: pt hugging pillow and able to progress to eob  Transfers Overall transfer level: Needs assistance Equipment used: Rolling walker (2 wheeled) Transfers: Sit to/from Stand Sit to Stand: Min guard         General transfer comment: cues for hand placement    Balance Overall balance assessment: Needs assistance         Standing balance support: No upper extremity supported;During functional activity Standing balance-Kenneth Leach: Poor                     ADL Overall ADL's : Needs assistance/impaired   Eating/Feeding Details (indicate cue type and reason): poor PO intake at this time. Wife arriving with food Grooming: Wash/dry face;Oral care;Min guard;Standing;Sitting Grooming Details (indicate cue type and reason): standing for oral care and educated on cup to mouth to decr chest pain and sitting on toilet during bath to wash face Upper Body Bathing: Minimal assitance Upper Body Bathing Details (indicate cue type and reason): seated Lower Body Bathing:  Moderate assistance;Sitting/lateral leans Lower Body Bathing Details (indicate cue type and reason): seated for LB and educated on sponge bath in half bath until can transfer full flight of stairs Upper Body Dressing : Set up   Lower Body Dressing: Moderate assistance;Sitting/lateral leans Lower Body Dressing Details (indicate cue type and reason): pt with cues for hand placement for sit<>Stand and cues to dress R LE first  Toilet Transfer: Min guard   Toileting- Clothing Manipulation and Hygiene: Min guard       Functional mobility during ADLs: Min guard General ADL Comments: pt educated on sequence with RW stepping with R LE first and much improved transfer this session. tp completed full sponge bath in bathroom and sitting in recliner at end of session. Pt and wife without further questions at this time. Pt plans to sleep on couch and educated on positioning      Vision                     Perception     Praxis      Cognition   Behavior During Therapy: Kenneth Leach for tasks assessed/performed Overall Cognitive Status: Within Functional Limits for tasks assessed                       Extremity/Trunk Assessment               Exercises     Shoulder Instructions       General Comments  Pertinent Vitals/ Pain       Pain Assessment: Faces Faces Pain Leach: Hurts even more Pain Location: R flank pain at chest Pain Descriptors / Indicators: Discomfort Pain Intervention(s): Monitored during session;Premedicated before session;Repositioned  Home Living                                          Prior Functioning/Environment              Frequency Min 3X/week     Progress Toward Goals  OT Goals(current goals can now be found in the care plan section)  Progress towards OT goals: Progressing toward goals  Acute Rehab OT Goals Patient Stated Goal: to get up OT Goal Formulation: With patient/family Time For Goal Achievement:  09/02/14 Potential to Achieve Goals: Good  Plan Discharge plan remains appropriate    Co-evaluation                 End of Session Equipment Utilized During Treatment: Rolling walker   Activity Tolerance Patient tolerated treatment well (diaphoretic )   Patient Left in chair;with call bell/phone within reach;with family/visitor present   Nurse Communication Mobility status;Precautions    Functional Assessment Tool Used: clinical judgement Functional Limitation: Self care Self Care Current Status [phone]): At least 20 percent but less than 40 percent impaired, limited or restricted Self Care Goal Status (R4270): At least 1 percent but less than 20 percent impaired, limited or restricted   Time: 0830-0912 OT Time Calculation (min): 42 min  Charges: OT G-codes **NOT FOR INPATIENT CLASS** Functional Assessment Tool Used: clinical judgement Functional Limitation: Self care Self Care Current Status (W2376): At least 20 percent but less than 40 percent impaired, limited or restricted Self Care Goal Status (E8315): At least 1 percent but less than 20 percent impaired, limited or restricted OT General Charges $OT Visit: 1 Procedure OT Treatments $Self Care/Home Management : 38-52 mins  Kenneth Leach 08/20/2014, 10:08 AM  Pager: [phone]

## 2014-08-21 ENCOUNTER — Observation Stay (HOSPITAL_COMMUNITY): Payer: BLUE CROSS/BLUE SHIELD

## 2014-08-21 LAB — CBC
HEMATOCRIT: 37 % — AB (ref 39.0–52.0)
HEMOGLOBIN: 12.5 g/dL — AB (ref 13.0–17.0)
MCH: 30.5 pg (ref 26.0–34.0)
MCHC: 33.8 g/dL (ref 30.0–36.0)
MCV: 90.2 fL (ref 78.0–100.0)
Platelets: 211 10*3/uL (ref 150–400)
RBC: 4.1 MIL/uL — AB (ref 4.22–5.81)
RDW: 13.4 % (ref 11.5–15.5)
WBC: 10.5 10*3/uL (ref 4.0–10.5)

## 2014-08-21 LAB — BASIC METABOLIC PANEL
ANION GAP: 13 (ref 5–15)
BUN: 12 mg/dL (ref 6–20)
CO2: 24 mmol/L (ref 22–32)
Calcium: 9 mg/dL (ref 8.9–10.3)
Chloride: 98 mmol/L — ABNORMAL LOW (ref 101–111)
Creatinine, Ser: 0.82 mg/dL (ref 0.61–1.24)
GFR calc Af Amer: >60 mL/min (ref >60)
GLUCOSE: 115 mg/dL — AB (ref 65–99)
POTASSIUM: 4 mmol/L (ref 3.5–5.1)
SODIUM: 135 mmol/L (ref 135–145)

## 2014-08-21 MED ORDER — NEOSTIGMINE METHYLSULFATE 10 MG/10ML IV SOLN
2.0000 mg | Freq: Once | INTRAVENOUS | Status: AC
  Administered 2014-08-21: 2 mg via INTRAVENOUS
  Filled 2014-08-21 (×2): qty 2

## 2014-08-21 MED ORDER — ATROPINE SULFATE 0.1 MG/ML IJ SOLN
0.5000 mg | INTRAMUSCULAR | Status: DC | PRN
  Filled 2014-08-21: qty 10

## 2014-08-21 MED ORDER — SODIUM CHLORIDE 0.45 % IV SOLN
INTRAVENOUS | Status: DC
  Administered 2014-08-21 – 2014-08-25 (×7): via INTRAVENOUS
  Filled 2014-08-21 (×14): qty 1000

## 2014-08-21 NOTE — Progress Notes (Signed)
Patient ID: Kenneth Leach, male   DOB: 1955/05/07, 59 y.o.   MRN: [id number] Patient was ordered neostigmine earlier without any order for cardiac monitoring, he received medication per order without monitoring.  Vitals are now doing fine

## 2014-08-21 NOTE — Progress Notes (Signed)
Patient ID: Kenneth Leach, male   DOB: 1956-02-15, 59 y.o.   MRN: [id number]  LOS: 4 days  Subjective: Continues with bloating, denies pain. Some flatus with suppository, no BM. Denies N/V.   Objective: Vital signs in last 24 hours: Temp:  [97.6 F (36.4 C)-98.9 F (37.2 C)] 97.6 F (36.4 C) (06/11 0753) Pulse Rate:  [70-99] 99 (06/11 0753) Resp:  [16-20] 20 (06/11 0753) BP: (118-135)/(72-92) 118/89 mmHg (06/11 0753) SpO2:  [94 %-97 %] 96 % (06/11 0753) Last BM Date: 08/18/14   Physical Exam General appearance: alert and no distress Resp: clear to auscultation bilaterally Cardio: regular rate and rhythm GI: abnormal findings:  distended   Assessment/Plan: Fall from ladder Right rib/sternal fxs -- Pulmonary toilet Multiple contusions Ileus -- As long as x-ray still c/w [place]ilvie's will try neostigmine Multiple medical problems -- Home meds FEN -- No issues VTE -- SCD's, Lovenox Dispo -- PT/OT, home once ileus resolves    Lisette Abu, PA-C Pager: [phone] General Trauma PA Pager: [phone]  08/21/2014

## 2014-08-21 NOTE — Progress Notes (Signed)
OT assisted pt to bed with X-Ray tech present for in-room x-ray of abdomen. Pt at Supervision level for transfer and bed mobility. Significant rib and abdominal pain. Left in room with x-ray tech.   Cyndie Chime, OTR/L Occupational Therapist [phone] (pager)

## 2014-08-22 ENCOUNTER — Observation Stay (HOSPITAL_COMMUNITY): Payer: BLUE CROSS/BLUE SHIELD

## 2014-08-22 DIAGNOSIS — S50311A Abrasion of right elbow, initial encounter: Secondary | ICD-10-CM | POA: Diagnosis present

## 2014-08-22 DIAGNOSIS — S70311A Abrasion, right thigh, initial encounter: Secondary | ICD-10-CM | POA: Diagnosis present

## 2014-08-22 DIAGNOSIS — M7989 Other specified soft tissue disorders: Secondary | ICD-10-CM | POA: Diagnosis not present

## 2014-08-22 DIAGNOSIS — Y92007 Garden or yard of unspecified non-institutional (private) residence as the place of occurrence of the external cause: Secondary | ICD-10-CM | POA: Diagnosis not present

## 2014-08-22 DIAGNOSIS — Z791 Long term (current) use of non-steroidal anti-inflammatories (NSAID): Secondary | ICD-10-CM | POA: Diagnosis not present

## 2014-08-22 DIAGNOSIS — E785 Hyperlipidemia, unspecified: Secondary | ICD-10-CM | POA: Diagnosis present

## 2014-08-22 DIAGNOSIS — S2221XA Fracture of manubrium, initial encounter for closed fracture: Secondary | ICD-10-CM | POA: Diagnosis present

## 2014-08-22 DIAGNOSIS — R55 Syncope and collapse: Secondary | ICD-10-CM | POA: Diagnosis present

## 2014-08-22 DIAGNOSIS — Z79899 Other long term (current) drug therapy: Secondary | ICD-10-CM | POA: Diagnosis not present

## 2014-08-22 DIAGNOSIS — M79651 Pain in right thigh: Secondary | ICD-10-CM | POA: Diagnosis present

## 2014-08-22 DIAGNOSIS — K567 Ileus, unspecified: Secondary | ICD-10-CM | POA: Diagnosis present

## 2014-08-22 DIAGNOSIS — K219 Gastro-esophageal reflux disease without esophagitis: Secondary | ICD-10-CM | POA: Diagnosis present

## 2014-08-22 DIAGNOSIS — D62 Acute posthemorrhagic anemia: Secondary | ICD-10-CM | POA: Diagnosis present

## 2014-08-22 DIAGNOSIS — W11XXXA Fall on and from ladder, initial encounter: Secondary | ICD-10-CM | POA: Diagnosis present

## 2014-08-22 DIAGNOSIS — S2231XA Fracture of one rib, right side, initial encounter for closed fracture: Secondary | ICD-10-CM | POA: Diagnosis present

## 2014-08-22 MED ORDER — METOCLOPRAMIDE HCL 5 MG PO TABS
5.0000 mg | ORAL_TABLET | Freq: Three times a day (TID) | ORAL | Status: DC
  Administered 2014-08-22 – 2014-08-26 (×15): 5 mg via ORAL
  Filled 2014-08-22 (×15): qty 1

## 2014-08-22 MED ORDER — NEOSTIGMINE METHYLSULFATE 10 MG/10ML IV SOLN
2.0000 mg | Freq: Once | INTRAVENOUS | Status: AC
  Administered 2014-08-22: 2 mg via INTRAVENOUS
  Filled 2014-08-22: qty 2

## 2014-08-22 NOTE — Progress Notes (Signed)
Added Reglan to regimen.  He did have some response to second Neostigmine.  Kathryne Eriksson. Dahlia Bailiff, MD, [place] [phone] Trauma Surgeon

## 2014-08-22 NOTE — Consult Note (Signed)
Referring Provider: Dr. Greer Pickerel Primary Care Physician:  No primary care provider on file. Primary Gastroenterologist:  Althia Forts  Reason for Consultation:  Ileus  HPI: Kenneth Leach is a 59 y.o. male admitted following a chest trauma being seen for a consult today due to an ileus.  S/P Neostigmine yesterday and again today with small amount of stool and gas passed following the Neostigmine. Feels very bloated and having diffuse abdominal pain. Denies N/V. Xray consistent with ileus. Patient currently lying down in bedside chair. No family in room.   Past Medical History  Diagnosis Date  . Thyroid disease   . Hyperlipidemia   . GERD (gastroesophageal reflux disease)     History reviewed. No pertinent past surgical history.  Prior to Admission medications   Medication Sig Start Date End Date Taking? Authorizing Provider  ibuprofen (ADVIL,MOTRIN) 200 MG tablet Take 200-400 mg by mouth every 6 (six) hours as needed for moderate pain.   Yes Historical Provider, MD  omeprazole (PRILOSEC) 20 MG capsule Take 20 mg by mouth daily.   Yes Historical Provider, MD  simvastatin (ZOCOR) 40 MG tablet Take 40 mg by mouth daily. 06/01/14  Yes Historical Provider, MD  SYNTHROID 100 MCG tablet Take 100 mcg by mouth daily. 05/31/14  Yes Historical Provider, MD    Scheduled Meds: . docusate sodium  100 mg Oral BID  . enoxaparin (LOVENOX) injection  30 mg Subcutaneous Q12H  . ketorolac  15 mg Intravenous 4 times per day  . levothyroxine  100 mcg Oral QAC breakfast  . methocarbamol  1,500 mg Oral QID  . metoCLOPramide  5 mg Oral TID AC & HS  . pantoprazole  40 mg Oral Daily  . polyethylene glycol  17 g Oral Daily  . simvastatin  40 mg Oral QPC supper  . traMADol  100 mg Oral 4 times per day   Continuous Infusions: . sodium chloride 0.45 % 1,000 mL with potassium chloride 20 mEq infusion 100 mL/hr at 08/21/14 1344   PRN Meds:.atropine, bisacodyl, HYDROmorphone (DILAUDID) injection, ondansetron  **OR** ondansetron (ZOFRAN) IV, oxyCODONE  Allergies as of 08/18/2014  . (No Known Allergies)    History reviewed. No pertinent family history.  History   Social History  . Marital Status: Married    Spouse Name: N/A  . Number of Children: N/A  . Years of Education: N/A   Occupational History  . Not on file.   Social History Main Topics  . Smoking status: Never Smoker   . Smokeless tobacco: Not on file  . Alcohol Use: Yes  . Drug Use: No  . Sexual Activity: Not on file   Other Topics Concern  . Not on file   Social History Narrative  . No narrative on file    Review of Systems: All negative except as stated above in HPI.  Physical Exam: Vital signs: Filed Vitals:   08/22/14 1339  BP: 102/65  Pulse: 89  Temp: 98 F (36.7 C)  Resp: 18   Last BM Date: 08/21/14 General:   Alert,  Well-developed, well-nourished, pleasant and cooperative in NAD Head: atraumatic EENT: pupils equal and reactive, anicteric sclera Lungs:  Clear throughout to auscultation.   No wheezes, crackles, or rhonchi. No acute distress. Heart:  Regular rate and rhythm; no murmurs, clicks, rubs,  or gallops. Abdomen: distended, diffusely tender with guarding, hyperactive bowel sounds  Rectal:  Deferred Ext: no edema  GI:  Lab Results:  Recent Labs  08/21/14 1020  WBC 10.5  HGB  12.5*  HCT 37.0*  PLT 211   BMET  Recent Labs  08/21/14 1020  NA 135  K 4.0  CL 98*  CO2 24  GLUCOSE 115*  BUN 12  CREATININE 0.82  CALCIUM 9.0   LFT No results for input(s): PROT, ALBUMIN, AST, ALT, ALKPHOS, BILITOT, BILIDIR, IBILI in the last 72 hours. PT/INR No results for input(s): LABPROT, INR in the last 72 hours.   Studies/Results: Dg Abd Portable 1v  08/22/2014   CLINICAL DATA:  Ileus.  Bloating.  Diarrhea.  EXAM: PORTABLE ABDOMEN - 1 VIEW  COMPARISON:  08/21/2014.  08/20/2014.  CT 08/18/2014.  FINDINGS: There continues to be large amount a gas within the colon. There is also some gas in  nondilated distal small bowel. The pattern remains most consistent with ileus. No sign of free air on the supine images. No abnormal calcifications are bony findings.  IMPRESSION: Persistent ileus pattern with gas-filled distal small bowel and colon.   Electronically Signed   By: Nelson Chimes M.D.   On: 08/22/2014 09:00   Dg Abd Portable 1v  08/21/2014   CLINICAL DATA:  Ileus. Abdominal bloating and right-sided abdominal pain.  EXAM: PORTABLE ABDOMEN - 1 VIEW  COMPARISON:  08/20/2014  FINDINGS: There is persistent mild to moderate gaseous distension of the colon, not significantly changed. Gas is present in multiple nondilated small bowel loops. No gross intraperitoneal free air on limited supine imaging. No acute osseous abnormality or abnormal calcification.  IMPRESSION: Unchanged gaseous distention of the colon suggestive of ileus.   Electronically Signed   By: Logan Bores   On: 08/21/2014 12:26    Impression/Plan: Colonic ileus responding somewhat to Neostigmine. Reglan started this afternoon by Dr. Hulen Skains and agree with that plan. If ileus persists, then may need updated CT of abdomen but hold off on that for now. Consider tap water enema if small amount of BMs continued. I do not think colonic decompression indicated at this time. Continue supportive care. Dr. Oletta Lamas to f/u tomorrow.      [name]  08/22/2014, 1:50 PM  Pager [phone]  If no answer or after 5 PM call [phone]

## 2014-08-22 NOTE — Progress Notes (Signed)
Called by primary RN to assist with administering neostigmine as per MD order.  Patient and wife in room and explained procedure to them.  Wife very frustrated about "care given to husband and would like another doctor". On my arrival patient was sitting in chair, he got himself back to bed.  Atropine at the bedside during administration of medication. Patient placed on zoll monitor to monitor HR. Prior to starting HR 76, BP 126/76, RR 12, Sats 100%.  Patient placed on bedpan.  Patient given medication with no problems.  HR 63-85, Patient monitored for 45 minutes without complications.  Patient had passed small amount of liquid stool, mostly gas.  Patient states he feels a little better, but still feels very bloated.

## 2014-08-22 NOTE — Progress Notes (Signed)
Patient ID: Kenneth Leach, male   DOB: [birth year], 59 y.o.   MRN: [id number]  LOS: 5 days  Subjective: Still feels bloated. Had some flatus and stool with neostigmine yesterday, some more flatus since then.   Objective: Vital signs in last 24 hours: Temp:  [97.7 F (36.5 C)-99.7 F (37.6 C)] 97.8 F (36.6 C) (06/12 0558) Pulse Rate:  [80-103] 88 (06/12 0558) Resp:  [16-20] 20 (06/12 0558) BP: (118-137)/(68-112) 136/79 mmHg (06/12 0558) SpO2:  [93 %-98 %] 98 % (06/12 0558) Last BM Date: 08/21/14   Radiology Results Abd x-ray: Colonic ileus persists (official read pending)   Physical Exam General appearance: alert and no distress Resp: clear to auscultation bilaterally Cardio: regular rate and rhythm GI: Distended, NT, +BS   Assessment/Plan: Fall from ladder Right rib/sternal fxs -- Pulmonary toilet Multiple contusions Ileus -- Will give one final dose of neostigmine since only partial response yesterday Multiple medical problems -- Home meds FEN -- No issues VTE -- SCD's, Lovenox Dispo -- PT/OT, home once ileus resolves    Lisette Abu, PA-C Pager: [phone] General Trauma PA Pager: [phone]  08/22/2014

## 2014-08-23 ENCOUNTER — Inpatient Hospital Stay (HOSPITAL_COMMUNITY): Payer: BLUE CROSS/BLUE SHIELD

## 2014-08-23 ENCOUNTER — Encounter (HOSPITAL_COMMUNITY): Payer: Self-pay | Admitting: Radiology

## 2014-08-23 DIAGNOSIS — M7989 Other specified soft tissue disorders: Secondary | ICD-10-CM

## 2014-08-23 DIAGNOSIS — D62 Acute posthemorrhagic anemia: Secondary | ICD-10-CM | POA: Diagnosis not present

## 2014-08-23 DIAGNOSIS — S7010XA Contusion of unspecified thigh, initial encounter: Secondary | ICD-10-CM | POA: Diagnosis present

## 2014-08-23 DIAGNOSIS — T07XXXA Unspecified multiple injuries, initial encounter: Secondary | ICD-10-CM

## 2014-08-23 LAB — CBC
HCT: 30.1 % — ABNORMAL LOW (ref 39.0–52.0)
HEMOGLOBIN: 10.2 g/dL — AB (ref 13.0–17.0)
MCH: 30.2 pg (ref 26.0–34.0)
MCHC: 33.9 g/dL (ref 30.0–36.0)
MCV: 89.1 fL (ref 78.0–100.0)
Platelets: 227 10*3/uL (ref 150–400)
RBC: 3.38 MIL/uL — ABNORMAL LOW (ref 4.22–5.81)
RDW: 13.3 % (ref 11.5–15.5)
WBC: 8.7 10*3/uL (ref 4.0–10.5)

## 2014-08-23 LAB — BASIC METABOLIC PANEL
ANION GAP: 10 (ref 5–15)
BUN: 21 mg/dL — ABNORMAL HIGH (ref 6–20)
CO2: 23 mmol/L (ref 22–32)
Calcium: 8.5 mg/dL — ABNORMAL LOW (ref 8.9–10.3)
Chloride: 99 mmol/L — ABNORMAL LOW (ref 101–111)
Creatinine, Ser: 0.85 mg/dL (ref 0.61–1.24)
Glucose, Bld: 105 mg/dL — ABNORMAL HIGH (ref 65–99)
Potassium: 3.8 mmol/L (ref 3.5–5.1)
Sodium: 132 mmol/L — ABNORMAL LOW (ref 135–145)

## 2014-08-23 LAB — GLUCOSE, CAPILLARY: Glucose-Capillary: 85 mg/dL (ref 65–99)

## 2014-08-23 LAB — MAGNESIUM: Magnesium: 1.8 mg/dL (ref 1.7–2.4)

## 2014-08-23 MED ORDER — IOHEXOL 300 MG/ML  SOLN
100.0000 mL | Freq: Once | INTRAMUSCULAR | Status: AC | PRN
  Administered 2014-08-23: 100 mL via INTRAVENOUS

## 2014-08-23 MED ORDER — POTASSIUM CHLORIDE 20 MEQ PO PACK
20.0000 meq | PACK | Freq: Three times a day (TID) | ORAL | Status: AC
  Administered 2014-08-23 (×3): 20 meq via ORAL
  Filled 2014-08-23 (×3): qty 1

## 2014-08-23 MED ORDER — METHOCARBAMOL 500 MG PO TABS: 1000.0000 mg | ORAL_TABLET | Freq: Four times a day (QID) | ORAL | Status: DC | PRN

## 2014-08-23 NOTE — Progress Notes (Signed)
Occupational Therapy Treatment Patient Details Name: Kenneth Leach MRN: [id number] DOB: 07/27/1955 Today's Date: 08/23/2014    History of present illness 59 yo male s/p fall from ladder with R thigh hematoma, R 2nd rib fx.    OT comments  This 59 yo male making progress and all education completed with pt concerning BADLs. He reports his wife can A him when not at work and his daughter can A him while wife is at work if he needs something. Acute OT will sign off.  Follow Up Recommendations  Supervision - Intermittent;No OT follow up    Equipment Recommendations  3 in 1 bedside comode       Precautions / Restrictions Precautions Precautions: Fall Precaution Comments: right 2nd rib fx Restrictions Weight Bearing Restrictions: No              ADL                                         General ADL Comments: Per PT and PT note, pt is moving at a S level currently for bed mobility and ambulation and will have this at home. In speaking with him, he has only a 1/2 bath downstairs and will sponge bathe until he feels he can go up and down his steps at home (when he is able I recommended that he go up at night and now in the morning--not up and down several times a day). He states he will sleep in a recliner downstairs for the time being. He reports that his wife and daughter can A him with LBD--I just recommneded that he put his RLE in pants/shorts/underwear first since it is the less mobile leg. I recommended that he could use a 3n1 to sit on to sponge bath downstairs and then when he is able to go up stairs he can use the 3n1 as a shower seat.                Cognition   Behavior During Therapy: WFL for tasks assessed/performed Overall Cognitive Status: Within Functional Limits for tasks assessed                       Extremity/Trunk Assessment   Still having difficulty raising RUE at shoulder due to pain from right 2nd rib fx.                        Pertinent Vitals/ Pain       Pain Assessment: 0-10 Pain Score: 4  Pain Location: right upper side (rib fx) Pain Descriptors / Indicators: Sore;Discomfort Pain Intervention(s): Monitored during session            Progress Toward Goals  OT Goals(current goals can now be found in the care plan section)  Progress towards OT goals:  (All education completed and pt without further questions about BADLs)  Acute Rehab OT Goals Patient Stated Goal: to get up  Plan Discharge plan remains appropriate       End of Session     Activity Tolerance Patient tolerated treatment well   Patient Left in bed;with call bell/phone within reach           Time: 1425-1433 OT Time Calculation (min): 8 min  Charges: OT General Charges $OT Visit: 1 Procedure OT Treatments $Self Care/Home Management : 8-22 mins  Rolm Baptise  [place] [phone] 08/23/2014, 2:51 PM

## 2014-08-23 NOTE — Progress Notes (Signed)
Physical Therapy Treatment Patient Details Name: Kenneth Leach MRN: [id number] DOB: 1955-04-23 Today's Date: 08/23/2014    History of Present Illness 59 yo male s/p fall from ladder with R thigh hematoma, R 2nd rib fx.     PT Comments    Patient tolerated increased activity and mobility this session. Patient able to perform functional tasks with minimal assistance. Pt continues to endorse discomfort in abdominal region [id number] and right flank. Reports RLE pain has improved. Will continue to see and progress as tolerated. Encouraged increased mobility.   Follow Up Recommendations  Supervision/Assistance - 24 hour;Supervision for mobility/OOB     Equipment Recommendations  Rolling walker with 5" wheels    Recommendations for Other Services       Precautions / Restrictions Precautions Precautions: Fall Restrictions Weight Bearing Restrictions: No    Mobility  Bed Mobility Overal bed mobility: Needs Assistance Bed Mobility: Rolling;Supine to Sit Rolling: Supervision   Supine to sit: Supervision     General bed mobility comments: no physical assist required, increased time   Transfers Overall transfer level: Needs assistance Equipment used: Rolling walker (2 wheeled) Transfers: Sit to/from Stand Sit to Stand: Supervision         General transfer comment: Able to perform from 4 different surfaces without physical assist (bed, toilet, chair, and recliner)  Ambulation/Gait Ambulation/Gait assistance: Supervision Ambulation Distance (Feet): 380 Feet Assistive device: Rolling walker (2 wheeled) Gait Pattern/deviations: Step-through pattern;Decreased stride length Gait velocity: decreased but improving Gait velocity interpretation: Below normal speed for age/gender General Gait Details: no physical assist needed, ambulating in room without difficulty despite pain.    Stairs            Wheelchair Mobility    Modified Rankin (Stroke Patients Only)        Balance   Sitting-balance support: No upper extremity supported Sitting balance-Leahy Scale: Fair     Standing balance support: No upper extremity supported Standing balance-Leahy Scale: Fair                      Cognition Arousal/Alertness: Awake/alert Behavior During Therapy: WFL for tasks assessed/performed Overall Cognitive Status: Within Functional Limits for tasks assessed                      Exercises      General Comments General comments (skin integrity, edema, etc.): performed various functional tasks in conjunction with transfers. Dynamic seated balance in chair with no armrests. Increased time to position and perform reaching activities secondary to pain. Some assist provided with self care tasks. Reinforced education related to breathing and mobility. patient receptive and appreciative.      Pertinent Vitals/Pain Pain Assessment: 0-10 Pain Score: 4  Pain Location: right side Pain Descriptors / Indicators: Discomfort Pain Intervention(s): Monitored during session;Repositioned    Home Living                      Prior Function            PT Goals (current goals can now be found in the care plan section) Acute Rehab PT Goals Patient Stated Goal: to get up PT Goal Formulation: With patient Time For Goal Achievement: 09/02/14 Potential to Achieve Goals: Good Progress towards PT goals: Progressing toward goals    Frequency  Min 3X/week    PT Plan Current plan remains appropriate    Co-evaluation             End  of Session   Activity Tolerance: No increased pain Patient left: in chair;with call bell/phone within reach;with family/visitor present     Time: 6438-3779 PT Time Calculation (min) (ACUTE ONLY): 23 min  Charges:  $Gait Training: 8-22 mins $Therapeutic Activity: 8-22 mins                    G CodesDuncan Dull 09-11-14, 12:20 PM Alben Deeds, [place] DPT  [phone]

## 2014-08-23 NOTE — Progress Notes (Signed)
Patient ID: Kenneth Leach, male   DOB: 1955/04/19, 59 y.o.   MRN: [id number]   LOS: 1 day   Subjective: Feels a little better, had moderate amt of liquid stool this morning. Wife concerned regarding RLE swelling   Objective: Vital signs in last 24 hours: Temp:  [98 F (36.7 C)-99 F (37.2 C)] 98 F (36.7 C) (06/13 0532) Pulse Rate:  [77-92] 77 (06/13 0532) Resp:  [18-20] 20 (06/13 0532) BP: (102-144)/(65-89) 128/78 mmHg (06/13 0532) SpO2:  [100 %] 100 % (06/13 0532) Last BM Date: 08/22/14   Laboratory  CBC  Recent Labs  08/21/14 1020 08/23/14 0301  WBC 10.5 8.7  HGB 12.5* 10.2*  HCT 37.0* 30.1*  PLT 211 227   BMET  Recent Labs  08/21/14 1020 08/23/14 0301  NA 135 132*  K 4.0 3.8  CL 98* 99*  CO2 24 23  GLUCOSE 115* 105*  BUN 12 21*  CREATININE 0.82 0.85  CALCIUM 9.0 8.5*    Radiology Results PORTABLE ABDOMEN - 1 VIEW  COMPARISON: None.  FINDINGS: Mild diffuse gaseous distention of the colon shows no significant change. Gas-filled nondilated small bowel loops again seen. These findings remain consistent with an ileus. No evidence of dilated small bowel loops.  IMPRESSION: Mild ileus pattern, without significant change.   Electronically Signed  By: Earle Gell M.D.  On: 08/23/2014 07:54   Physical Exam General appearance: alert and no distress Resp: clear to auscultation bilaterally Cardio: regular rate and rhythm GI: normal findings: Moderate distension, tympanic BS Extremities: RLE: Swollen, lateral/posterior thigh hematoma soft, mild TTP superiolateral calf   Assessment/Plan: Fall from ladder Right rib/sternal fxs -- Pulmonary toilet Multiple contusions -- Check dopplers ABL anemia -- Given 2g+ loss over last 2 days coupled with otherwise unexplained ileus will check another CT scan of abd/pelvis. Plts increased which is reassuring. Ileus -- Appreciate GI consultation Multiple medical problems -- Home meds FEN -- No issues VTE  -- SCD's, Lovenox Dispo -- PT/OT, home once ileus resolves    Lisette Abu, PA-C Pager: [phone] General Trauma PA Pager: [phone]  08/23/2014

## 2014-08-23 NOTE — Progress Notes (Signed)
*  Preliminary Results* Right lower extremity venous duplex completed. Right lower extremity is negative for deep vein thrombosis. There is no evidence of right Baker's cyst.  08/23/2014 12:22 PM  Maudry Mayhew, RVT, RDCS, RDMS

## 2014-08-23 NOTE — Progress Notes (Signed)
[facility] PROGRESS NOTE Subjective Pt had BM this am but is still bloated, taking oxycodone for pain  Objective: Vital signs in last 24 hours: Temp:  [98 F (36.7 C)-99 F (37.2 C)] 98 F (36.7 C) (06/13 0532) Pulse Rate:  [77-92] 77 (06/13 0532) Resp:  [18-20] 20 (06/13 0532) BP: (102-144)/(65-89) 128/78 mmHg (06/13 0532) SpO2:  [100 %] 100 % (06/13 0532) Last BM Date: 08/22/14  Intake/Output from previous day: 06/12 0701 - 06/13 0700 In: -  Out: 400 [Urine:400] Intake/Output this shift: Total I/O In: -  Out: 400 [Urine:400]  PE: General--alert oriented sitting in chair  Abdomen--distended, BSs present minimal tenderness  Lab Results:  Recent Labs  08/21/14 1020 08/23/14 0301  WBC 10.5 8.7  HGB 12.5* 10.2*  HCT 37.0* 30.1*  PLT 211 227   BMET  Recent Labs  08/21/14 1020 08/23/14 0301  NA 135 132*  K 4.0 3.8  CL 98* 99*  CO2 24 23  CREATININE 0.82 0.85   LFT No results for input(s): PROT, AST, ALT, ALKPHOS, BILITOT, BILIDIR, IBILI in the last 72 hours. PT/INR No results for input(s): LABPROT, INR in the last 72 hours. PANCREAS No results for input(s): LIPASE in the last 72 hours.       Studies/Results: Dg Abd Portable 1v  08/22/2014   CLINICAL DATA:  Ileus.  Bloating.  Diarrhea.  EXAM: PORTABLE ABDOMEN - 1 VIEW  COMPARISON:  08/21/2014.  08/20/2014.  CT 08/18/2014.  FINDINGS: There continues to be large amount a gas within the colon. There is also some gas in nondilated distal small bowel. The pattern remains most consistent with ileus. No sign of free air on the supine images. No abnormal calcifications are bony findings.  IMPRESSION: Persistent ileus pattern with gas-filled distal small bowel and colon.   Electronically Signed   By: Nelson Chimes M.D.   On: 08/22/2014 09:00   Dg Abd Portable 1v  08/21/2014   CLINICAL DATA:  Ileus. Abdominal bloating and right-sided abdominal pain.  EXAM: PORTABLE ABDOMEN - 1 VIEW  COMPARISON:   08/20/2014  FINDINGS: There is persistent mild to moderate gaseous distension of the colon, not significantly changed. Gas is present in multiple nondilated small bowel loops. No gross intraperitoneal free air on limited supine imaging. No acute osseous abnormality or abnormal calcification.  IMPRESSION: Unchanged gaseous distention of the colon suggestive of ileus.   Electronically Signed   By: Logan Bores   On: 08/21/2014 12:26    Medications: I have reviewed the patient's current medications.  Assessment/Plan: 1. Colonic Ileus. Still increased gas in colon on KUB, K is borderline, will give some additional K orally to attempt to keep the K 4.5-5.0   Tammye Kahler JR,Hermon Zea L 08/23/2014, 6:57 AM  Pager: [phone] If no answer or after hours call [phone]

## 2014-08-24 LAB — CBC
HEMATOCRIT: 31.4 % — AB (ref 39.0–52.0)
Hemoglobin: 10.6 g/dL — ABNORMAL LOW (ref 13.0–17.0)
MCH: 30.1 pg (ref 26.0–34.0)
MCHC: 33.8 g/dL (ref 30.0–36.0)
MCV: 89.2 fL (ref 78.0–100.0)
Platelets: 237 10*3/uL (ref 150–400)
RBC: 3.52 MIL/uL — AB (ref 4.22–5.81)
RDW: 13.4 % (ref 11.5–15.5)
WBC: 6.9 10*3/uL (ref 4.0–10.5)

## 2014-08-24 LAB — BASIC METABOLIC PANEL WITH GFR
Anion gap: 9 (ref 5–15)
BUN: 13 mg/dL (ref 6–20)
CO2: 24 mmol/L (ref 22–32)
Calcium: 9 mg/dL (ref 8.9–10.3)
Chloride: 101 mmol/L (ref 101–111)
Creatinine, Ser: 0.8 mg/dL (ref 0.61–1.24)
GFR calc Af Amer: >60 mL/min
GFR calc non Af Amer: >60 mL/min
Glucose, Bld: 99 mg/dL (ref 65–99)
Potassium: 3.7 mmol/L (ref 3.5–5.1)
Sodium: 134 mmol/L — ABNORMAL LOW (ref 135–145)

## 2014-08-24 MED ORDER — POTASSIUM CHLORIDE 20 MEQ PO PACK: 40.0000 meq | PACK | Freq: Three times a day (TID) | ORAL | Status: DC

## 2014-08-24 MED ORDER — POTASSIUM CHLORIDE CRYS ER 20 MEQ PO TBCR
40.0000 meq | EXTENDED_RELEASE_TABLET | Freq: Three times a day (TID) | ORAL | Status: AC
  Administered 2014-08-24 – 2014-08-25 (×3): 40 meq via ORAL
  Filled 2014-08-24 (×3): qty 2

## 2014-08-24 NOTE — Progress Notes (Signed)
Patient ID: Kenneth Leach, male   DOB: 10-09-1955, 59 y.o.   MRN: [id number]  LOS: 2 days   Subjective: [place] 3-4 times per day.  Tolerating POs, had 2 good BMs yesterday.  No n/v, but burping a lot.    Objective: Vital signs in last 24 hours: Temp:  [97.8 F (36.6 C)-98.6 F (37 C)] 98.3 F (36.8 C) (06/14 0601) Pulse Rate:  [65-81] 65 (06/14 0601) Resp:  [18-20] 18 (06/14 0601) BP: (115-126)/(70-77) 116/73 mmHg (06/14 0601) SpO2:  [97 %-100 %] 98 % (06/14 0601) Last BM Date: 08/23/14  Lab Results:  CBC  Recent Labs  08/23/14 0301 08/24/14 0558  WBC 8.7 6.9  HGB 10.2* 10.6*  HCT 30.1* 31.4*  PLT 227 237   BMET  Recent Labs  08/21/14 1020 08/23/14 0301  NA 135 132*  K 4.0 3.8  CL 98* 99*  CO2 24 23  GLUCOSE 115* 105*  BUN 12 21*  CREATININE 0.82 0.85  CALCIUM 9.0 8.5*    Imaging: Ct Abdomen Pelvis W Contrast  08/23/2014   CLINICAL DATA:  Status post 15 foot fall 08/18/2014. Abdominal pain and bloating.  EXAM: CT ABDOMEN AND PELVIS WITH CONTRAST  TECHNIQUE: Multidetector CT imaging of the abdomen and pelvis was performed using the standard protocol following bolus administration of intravenous contrast.  CONTRAST:  100 mL OMNIPAQUE IOHEXOL 300 MG/ML  SOLN  COMPARISON:  CT chest, abdomen and pelvis 08/18/2014.  FINDINGS: The patient has a new small right pleural effusion. No left pleural effusion or pericardial effusion is identified. Heart size is upper normal. Dependent atelectasis is present in the lung bases, worse on the right.  The gallbladder, liver, adrenal glands, spleen, pancreas and kidneys appear normal.  Oral contrast is seen throughout the small bowel and in the ascending colon. The small bowel is normal in caliber. There is diffuse mild to moderate distention of the colon most consistent with colonic ileus. A small volume of liquid stool is seen in the descending colon with formed stool in the ascending colon noted. Small fat containing inguinal hernias are  identified, larger on the left. There is a very small volume of free pelvic fluid. No lymphadenopathy is identified.  There is infiltration of subcutaneous fat about the right side of the pelvis and upper leg with an appearance most consistent with contusion/hematoma. Right ninth and tenth rib fractures are identified. No other focal bony abnormality is seen.  IMPRESSION: Mild to moderate gaseous distention of the colon throughout is most consistent with ileus. There is no evidence of bowel obstruction.  Right ninth and tenth rib fractures are identified. These are not visible on the comparison CT scan.  Small right pleural effusion and bibasilar atelectasis, greater on the right.  Hematoma/contusion in subcutaneous fat about the right hip and pelvis is new since the prior examination.  Small fat containing bilateral inguinal hernias, larger on the right.   Electronically Signed   By: Inge Rise M.D.   On: 08/23/2014 14:18   Dg Tibia/fibula Right Port  08/23/2014   CLINICAL DATA:  59 year old male with posterior tib-fib region pain and tenderness. Initial encounter.  EXAM: PORTABLE RIGHT TIBIA AND FIBULA - 2 VIEW  COMPARISON:  None.  FINDINGS: Portable supine AP and cross-table lateral views of the right tib-fib. Alignment at the right knee and ankle appears preserved. There is a small suprapatellar joint effusion. Bone mineralization appears normal throughout. No subcutaneous gas. There is evidence of subcutaneous stranding greater along the lateral aspect.  No radiopaque foreign body identified. No acute osseous abnormality identified.  IMPRESSION: 1.  No acute osseous abnormality identified about the right tib-fib. 2. Small right knee joint effusion. Nonspecific soft tissue stranding, greater laterally.   Electronically Signed   By: Genevie Ann M.D.   On: 08/23/2014 09:41   Dg Abd Portable 1v  08/23/2014   CLINICAL DATA:  Abdominal pain and distention. Ileus. Rib and sternal fractures from fall.  EXAM:  PORTABLE ABDOMEN - 1 VIEW  COMPARISON:  None.  FINDINGS: Mild diffuse gaseous distention of the colon shows no significant change. Gas-filled nondilated small bowel loops again seen. These findings remain consistent with an ileus. No evidence of dilated small bowel loops.  IMPRESSION: Mild ileus pattern, without significant change.   Electronically Signed   By: Earle Gell M.D.   On: 08/23/2014 07:54     PE: General appearance: alert, cooperative and no distress Resp: clear to auscultation bilaterally Cardio: regular rate and rhythm, S1, S2 normal, no murmur, click, rub or gallop GI: +bs, abdomen is distended, soft, non tender. Extremities: extremities normal, atraumatic, no cyanosis or edema    Patient Active Problem List   Diagnosis Date Noted  . Multiple contusions 08/23/2014  . Thigh hematoma 08/23/2014  . Acute blood loss anemia 08/23/2014  . Ileus 08/20/2014  . Fall from ladder 08/19/2014  . Sternal fracture 08/19/2014  . Right rib fracture 08/19/2014  . Mediastinal hematoma 08/18/2014    Assessment/Plan: Fall from ladder Right rib/sternal fxs -- Pulmonary toilet Multiple contusions -- prelim dopplers negative for DVT ABL anemia -- Ct A/P right hip hematoma.  H&H stable Ileus -- Appreciate GI consultation.  Having BMs and tolerating POs, but remains distended. BMP pending.  Multiple medical problems -- Home meds FEN -- No issues VTE -- SCD's, Lovenox Dispo -- once ileus resolves.  Supervision/24h assist  Erby Pian, ANP-BC Pager: [phone] General Trauma PA Pager: [phone]   08/24/2014 8:05 AM

## 2014-08-24 NOTE — Progress Notes (Signed)
[facility] PROGRESS NOTE Subjective Pt still bloated but had BM after suppository K actually down despite replacement yesterday  Objective: Vital signs in last 24 hours: Temp:  [97.8 F (36.6 C)-98.3 F (36.8 C)] 98.2 F (36.8 C) (06/14 1440) Pulse Rate:  [65-81] 72 (06/14 1440) Resp:  [18-20] 18 (06/14 1440) BP: (103-126)/(65-84) 103/65 mmHg (06/14 1440) SpO2:  [98 %-100 %] 98 % (06/14 1440) Last BM Date: 08/23/14  Intake/Output from previous day: 06/13 0701 - 06/14 0700 In: 6426.7 [I.V.:6426.7] Out: 250 [Urine:250] Intake/Output this shift:    PE: General--NAD  Abdomen--distended, minimally tender few BSs  Lab Results:  Recent Labs  08/23/14 0301 08/24/14 0558  WBC 8.7 6.9  HGB 10.2* 10.6*  HCT 30.1* 31.4*  PLT 227 237   BMET  Recent Labs  08/23/14 0301 08/24/14 0808  NA 132* 134*  K 3.8 3.7  CL 99* 101  CO2 23 24  CREATININE 0.85 0.80   LFT No results for input(s): PROT, AST, ALT, ALKPHOS, BILITOT, BILIDIR, IBILI in the last 72 hours. PT/INR No results for input(s): LABPROT, INR in the last 72 hours. PANCREAS No results for input(s): LIPASE in the last 72 hours.       Studies/Results: Ct Abdomen Pelvis W Contrast  08/23/2014   CLINICAL DATA:  Status post 15 foot fall 08/18/2014. Abdominal pain and bloating.  EXAM: CT ABDOMEN AND PELVIS WITH CONTRAST  TECHNIQUE: Multidetector CT imaging of the abdomen and pelvis was performed using the standard protocol following bolus administration of intravenous contrast.  CONTRAST:  100 mL OMNIPAQUE IOHEXOL 300 MG/ML  SOLN  COMPARISON:  CT chest, abdomen and pelvis 08/18/2014.  FINDINGS: The patient has a new small right pleural effusion. No left pleural effusion or pericardial effusion is identified. Heart size is upper normal. Dependent atelectasis is present in the lung bases, worse on the right.  The gallbladder, liver, adrenal glands, spleen, pancreas and kidneys appear normal.  Oral contrast is  seen throughout the small bowel and in the ascending colon. The small bowel is normal in caliber. There is diffuse mild to moderate distention of the colon most consistent with colonic ileus. A small volume of liquid stool is seen in the descending colon with formed stool in the ascending colon noted. Small fat containing inguinal hernias are identified, larger on the left. There is a very small volume of free pelvic fluid. No lymphadenopathy is identified.  There is infiltration of subcutaneous fat about the right side of the pelvis and upper leg with an appearance most consistent with contusion/hematoma. Right ninth and tenth rib fractures are identified. No other focal bony abnormality is seen.  IMPRESSION: Mild to moderate gaseous distention of the colon throughout is most consistent with ileus. There is no evidence of bowel obstruction.  Right ninth and tenth rib fractures are identified. These are not visible on the comparison CT scan.  Small right pleural effusion and bibasilar atelectasis, greater on the right.  Hematoma/contusion in subcutaneous fat about the right hip and pelvis is new since the prior examination.  Small fat containing bilateral inguinal hernias, larger on the right.   Electronically Signed   By: Inge Rise M.D.   On: 08/23/2014 14:18   Dg Tibia/fibula Right Port  08/23/2014   CLINICAL DATA:  59 year old male with posterior tib-fib region pain and tenderness. Initial encounter.  EXAM: PORTABLE RIGHT TIBIA AND FIBULA - 2 VIEW  COMPARISON:  None.  FINDINGS: Portable supine AP and cross-table lateral views of the right  tib-fib. Alignment at the right knee and ankle appears preserved. There is a small suprapatellar joint effusion. Bone mineralization appears normal throughout. No subcutaneous gas. There is evidence of subcutaneous stranding greater along the lateral aspect. No radiopaque foreign body identified. No acute osseous abnormality identified.  IMPRESSION: 1.  No acute  osseous abnormality identified about the right tib-fib. 2. Small right knee joint effusion. Nonspecific soft tissue stranding, greater laterally.   Electronically Signed   By: Genevie Ann M.D.   On: 08/23/2014 09:41   Dg Abd Portable 1v  08/23/2014   CLINICAL DATA:  Abdominal pain and distention. Ileus. Rib and sternal fractures from fall.  EXAM: PORTABLE ABDOMEN - 1 VIEW  COMPARISON:  None.  FINDINGS: Mild diffuse gaseous distention of the colon shows no significant change. Gas-filled nondilated small bowel loops again seen. These findings remain consistent with an ileus. No evidence of dilated small bowel loops.  IMPRESSION: Mild ileus pattern, without significant change.   Electronically Signed   By: Earle Gell M.D.   On: 08/23/2014 07:54    Medications: I have reviewed the patient's current medications.  Assessment/Plan: 1. Colonic Ileus. Probably due to combination of narcotics and low K will give additional K today   Kue Fox JR,Elienai Gailey L 08/24/2014, 2:59 PM  Pager: [phone] If no answer or after hours call [phone]

## 2014-08-25 ENCOUNTER — Inpatient Hospital Stay (HOSPITAL_COMMUNITY): Payer: BLUE CROSS/BLUE SHIELD

## 2014-08-25 LAB — BASIC METABOLIC PANEL
Anion gap: 8 (ref 5–15)
BUN: 12 mg/dL (ref 6–20)
CALCIUM: 8.9 mg/dL (ref 8.9–10.3)
CO2: 22 mmol/L (ref 22–32)
Chloride: 102 mmol/L (ref 101–111)
Creatinine, Ser: 0.77 mg/dL (ref 0.61–1.24)
GFR calc Af Amer: >60 mL/min (ref >60)
GFR calc non Af Amer: >60 mL/min (ref >60)
GLUCOSE: 93 mg/dL (ref 65–99)
POTASSIUM: 4.4 mmol/L (ref 3.5–5.1)
Sodium: 132 mmol/L — ABNORMAL LOW (ref 135–145)

## 2014-08-25 MED ORDER — POTASSIUM CHLORIDE CRYS ER 20 MEQ PO TBCR: 20.0000 meq | EXTENDED_RELEASE_TABLET | Freq: Two times a day (BID) | ORAL | Status: DC

## 2014-08-25 MED ORDER — POTASSIUM CHLORIDE CRYS ER 20 MEQ PO TBCR
20.0000 meq | EXTENDED_RELEASE_TABLET | Freq: Every day | ORAL | Status: DC
  Administered 2014-08-25 – 2014-08-26 (×2): 20 meq via ORAL
  Filled 2014-08-25 (×2): qty 1

## 2014-08-25 MED ORDER — TRAMADOL HCL 50 MG PO TABS
50.0000 mg | ORAL_TABLET | Freq: Four times a day (QID) | ORAL | Status: DC
Start: 2014-08-25 — End: 2014-08-26
  Administered 2014-08-25 – 2014-08-26 (×4): 50 mg via ORAL
  Filled 2014-08-25 (×4): qty 1

## 2014-08-25 NOTE — Progress Notes (Signed)
Patient ID: Kenneth Leach, male   DOB: 1956-02-06, 59 y.o.   MRN: [id number]  LOS: 3 days   Subjective: Tolerating POs.  Multiple BMs, lots of flatus today.  Walking.  Pain controlled. VSS.  Afebrile.    Objective: Vital signs in last 24 hours: Temp:  [97.6 F (36.4 C)-98.2 F (36.8 C)] 97.9 F (36.6 C) (06/15 0552) Pulse Rate:  [70-90] 70 (06/15 0552) Resp:  [18-20] 18 (06/15 0552) BP: (103-140)/(65-84) 126/80 mmHg (06/15 0552) SpO2:  [97 %-100 %] 97 % (06/15 0552) Last BM Date: 08/23/14  Lab Results:  CBC  Recent Labs  08/23/14 0301 08/24/14 0558  WBC 8.7 6.9  HGB 10.2* 10.6*  HCT 30.1* 31.4*  PLT 227 237   BMET  Recent Labs  08/24/14 0808 08/25/14 0400  NA 134* 132*  K 3.7 4.4  CL 101 102  CO2 24 22  GLUCOSE 99 93  BUN 13 12  CREATININE 0.80 0.77  CALCIUM 9.0 8.9    Imaging: Ct Abdomen Pelvis W Contrast  08/23/2014   CLINICAL DATA:  Status post 15 foot fall 08/18/2014. Abdominal pain and bloating.  EXAM: CT ABDOMEN AND PELVIS WITH CONTRAST  TECHNIQUE: Multidetector CT imaging of the abdomen and pelvis was performed using the standard protocol following bolus administration of intravenous contrast.  CONTRAST:  100 mL OMNIPAQUE IOHEXOL 300 MG/ML  SOLN  COMPARISON:  CT chest, abdomen and pelvis 08/18/2014.  FINDINGS: The patient has a new small right pleural effusion. No left pleural effusion or pericardial effusion is identified. Heart size is upper normal. Dependent atelectasis is present in the lung bases, worse on the right.  The gallbladder, liver, adrenal glands, spleen, pancreas and kidneys appear normal.  Oral contrast is seen throughout the small bowel and in the ascending colon. The small bowel is normal in caliber. There is diffuse mild to moderate distention of the colon most consistent with colonic ileus. A small volume of liquid stool is seen in the descending colon with formed stool in the ascending colon noted. Small fat containing inguinal hernias are  identified, larger on the left. There is a very small volume of free pelvic fluid. No lymphadenopathy is identified.  There is infiltration of subcutaneous fat about the right side of the pelvis and upper leg with an appearance most consistent with contusion/hematoma. Right ninth and tenth rib fractures are identified. No other focal bony abnormality is seen.  IMPRESSION: Mild to moderate gaseous distention of the colon throughout is most consistent with ileus. There is no evidence of bowel obstruction.  Right ninth and tenth rib fractures are identified. These are not visible on the comparison CT scan.  Small right pleural effusion and bibasilar atelectasis, greater on the right.  Hematoma/contusion in subcutaneous fat about the right hip and pelvis is new since the prior examination.  Small fat containing bilateral inguinal hernias, larger on the right.   Electronically Signed   By: Inge Rise M.D.   On: 08/23/2014 14:18   Dg Tibia/fibula Right Port  08/23/2014   CLINICAL DATA:  59 year old male with posterior tib-fib region pain and tenderness. Initial encounter.  EXAM: PORTABLE RIGHT TIBIA AND FIBULA - 2 VIEW  COMPARISON:  None.  FINDINGS: Portable supine AP and cross-table lateral views of the right tib-fib. Alignment at the right knee and ankle appears preserved. There is a small suprapatellar joint effusion. Bone mineralization appears normal throughout. No subcutaneous gas. There is evidence of subcutaneous stranding greater along the lateral aspect. No radiopaque foreign  body identified. No acute osseous abnormality identified.  IMPRESSION: 1.  No acute osseous abnormality identified about the right tib-fib. 2. Small right knee joint effusion. Nonspecific soft tissue stranding, greater laterally.   Electronically Signed   By: Genevie Ann M.D.   On: 08/23/2014 09:41    PE: General appearance: alert, cooperative and no distress Resp: clear to auscultation bilaterally Cardio: regular rate and  rhythm, S1, S2 normal, no murmur, click, rub or gallop GI: +bs, abdomen is distended, soft, non tender. Extremities: extremities normal, atraumatic, no cyanosis or edema   Patient Active Problem List   Diagnosis Date Noted  . Multiple contusions 08/23/2014  . Thigh hematoma 08/23/2014  . Acute blood loss anemia 08/23/2014  . Ileus 08/20/2014  . Fall from ladder 08/19/2014  . Sternal fracture 08/19/2014  . Right rib fracture 08/19/2014  . Mediastinal hematoma 08/18/2014    Assessment/Plan: Fall from ladder Right rib/sternal fxs -- Pulmonary toilet Multiple contusions -- dopplers negative for DVT ABL anemia -- Ct A/P right hip hematoma. H&H stable Ileus -- Appreciate GI consultation. Having BMs and tolerating POs, but remains distended.  ?check AXR.  Multiple medical problems -- Home meds FEN -- no issues.  k 4.4 ?decrease KCL dose.  SLIV, reduce tramadol. VTE -- SCD's, Lovenox Dispo -- once ileus resolves. Supervision/24h assist   Erby Pian, ANP-BC Pager: [phone] General Trauma PA Pager: [phone]   08/25/2014 9:14 AM

## 2014-08-25 NOTE — Progress Notes (Signed)
[facility] PROGRESS NOTE Subjective patient improving. Had multiple bowel movements and lots of flatus.  Objective: Vital signs in last 24 hours: Temp:  [97.6 F (36.4 C)-98.2 F (36.8 C)] 97.8 F (36.6 C) (06/15 0900) Pulse Rate:  [70-90] 82 (06/15 0900) Resp:  [18-20] 20 (06/15 0900) BP: (103-140)/(65-83) 128/83 mmHg (06/15 0900) SpO2:  [97 %-100 %] 99 % (06/15 0900) Last BM Date: 08/25/14  Intake/Output from previous day: 06/14 0701 - 06/15 0700 In: 440 [P.O.:440] Out: 200 [Urine:200] Intake/Output this shift: Total I/O In: 240 [P.O.:240] Out: -   PE:  Abdomen-- less distended, soft, good bowel sounds minimally tender  Lab Results:  Recent Labs  08/23/14 0301 08/24/14 0558  WBC 8.7 6.9  HGB 10.2* 10.6*  HCT 30.1* 31.4*  PLT 227 237   BMET  Recent Labs  08/23/14 0301 08/24/14 0808 08/25/14 0400  NA 132* 134* 132*  K 3.8 3.7 4.4  CL 99* 101 102  CO2 23 24 22   CREATININE 0.85 0.80 0.77   LFT No results for input(s): PROT, AST, ALT, ALKPHOS, BILITOT, BILIDIR, IBILI in the last 72 hours. PT/INR No results for input(s): LABPROT, INR in the last 72 hours. PANCREAS No results for input(s): LIPASE in the last 72 hours.       Studies/Results: Ct Abdomen Pelvis W Contrast  08/23/2014   CLINICAL DATA:  Status post 15 foot fall 08/18/2014. Abdominal pain and bloating.  EXAM: CT ABDOMEN AND PELVIS WITH CONTRAST  TECHNIQUE: Multidetector CT imaging of the abdomen and pelvis was performed using the standard protocol following bolus administration of intravenous contrast.  CONTRAST:  100 mL OMNIPAQUE IOHEXOL 300 MG/ML  SOLN  COMPARISON:  CT chest, abdomen and pelvis 08/18/2014.  FINDINGS: The patient has a new small right pleural effusion. No left pleural effusion or pericardial effusion is identified. Heart size is upper normal. Dependent atelectasis is present in the lung bases, worse on the right.  The gallbladder, liver, adrenal glands, spleen,  pancreas and kidneys appear normal.  Oral contrast is seen throughout the small bowel and in the ascending colon. The small bowel is normal in caliber. There is diffuse mild to moderate distention of the colon most consistent with colonic ileus. A small volume of liquid stool is seen in the descending colon with formed stool in the ascending colon noted. Small fat containing inguinal hernias are identified, larger on the left. There is a very small volume of free pelvic fluid. No lymphadenopathy is identified.  There is infiltration of subcutaneous fat about the right side of the pelvis and upper leg with an appearance most consistent with contusion/hematoma. Right ninth and tenth rib fractures are identified. No other focal bony abnormality is seen.  IMPRESSION: Mild to moderate gaseous distention of the colon throughout is most consistent with ileus. There is no evidence of bowel obstruction.  Right ninth and tenth rib fractures are identified. These are not visible on the comparison CT scan.  Small right pleural effusion and bibasilar atelectasis, greater on the right.  Hematoma/contusion in subcutaneous fat about the right hip and pelvis is new since the prior examination.  Small fat containing bilateral inguinal hernias, larger on the right.   Electronically Signed   By: Inge Rise M.D.   On: 08/23/2014 14:18    Medications: I have reviewed the patient's current medications.  Assessment/Plan: 1. Colonic ileus. Improved today with additional potassium. Up to 4.4.  Recommendation: would send him home on K dur 20 mEq daily. I would  like to see him back in the office about a month will [facility]'s doing and recheck his potassium. He may not need to be on chronic potassium replacement.   Thimothy Barretta JR,Maicie Vanderloop L 08/25/2014, 1:55 PM  Pager: [phone] If no answer or after hours call [phone]

## 2014-08-25 NOTE — Progress Notes (Signed)
Physical Therapy Discharge Patient Details Name: Kenneth Leach MRN: [id number] DOB: Apr 18, 1955 Today's Date: 08/25/2014 Time: 2773-7505 PT Time Calculation (min) (ACUTE ONLY): 16 min  Patient discharged from PT services secondary to goals met and no further PT needs identified.  Please see latest therapy progress note for current level of functioning and progress toward goals.    Progress and discharge plan discussed with patient and/or caregiver: Patient/Caregiver agrees with plan  GP     Rolla Etienne, PT DPT  [phone]  08/25/2014, 4:04 PM

## 2014-08-25 NOTE — Progress Notes (Signed)
Physical Therapy Treatment Patient Details Name: Kenneth Leach MRN: [id number] DOB: 29-Mar-1955 Today's Date: 09-08-2014    History of Present Illness 59 yo male s/p fall from ladder with R thigh hematoma, R 2nd rib fx.     PT Comments    Patient mobilizing well, performed stair negotiation without assist and ambulated with and without RW. Encouraged continued mobility throughout hospital course. No further acute PT needs, will sign off. Patient in agreement.   Follow Up Recommendations  Supervision/Assistance - 24 hour;Supervision for mobility/OOB     Equipment Recommendations  Rolling walker with 5" wheels    Recommendations for Other Services       Precautions / Restrictions Precautions Precautions: Fall Precaution Comments: right 2nd rib fx Restrictions Weight Bearing Restrictions: No    Mobility  Bed Mobility Overal bed mobility: Modified Independent                Transfers Overall transfer level: Modified independent                  Ambulation/Gait Ambulation/Gait assistance: Modified independent (Device/Increase time) Ambulation Distance (Feet): 310 Feet (90) Assistive device: Rolling walker (2 wheeled) (41f without device)           Stairs Stairs: Yes Stairs assistance: Modified independent (Device/Increase time) Stair Management: Forwards Number of Stairs: 6 General stair comments: no assist needed  Wheelchair Mobility    Modified Rankin (Stroke Patients Only)       Balance                                    Cognition Arousal/Alertness: Awake/alert Behavior During Therapy: WFL for tasks assessed/performed Overall Cognitive Status: Within Functional Limits for tasks assessed                      Exercises      General Comments        Pertinent Vitals/Pain Pain Assessment: 0-10 Pain Score: 4  Pain Location: right leg Pain Descriptors / Indicators: Sore;Discomfort Pain Intervention(s):  Monitored during session    Home Living                      Prior Function            PT Goals (current goals can now be found in the care plan section) Acute Rehab PT Goals PT Goal Formulation: With patient Time For Goal Achievement: 09/02/14 Potential to Achieve Goals: Good Progress towards PT goals: Goals met/education completed, patient discharged from PT    Frequency       PT Plan      Co-evaluation             End of Session   Activity Tolerance: No increased pain Patient left: in bed;with call bell/phone within reach     Time: 12330-0762PT Time Calculation (min) (ACUTE ONLY): 16 min  Charges:  $Gait Training: 8-22 mins                    G Codes:Duncan Dull629-Jun-2016 4:03 PM DAlben Deeds PRoscommonDPT  [phone]

## 2014-08-26 MED ORDER — METOCLOPRAMIDE HCL 5 MG PO TABS: 5.0000 mg | ORAL_TABLET | Freq: Three times a day (TID) | ORAL | Status: DC

## 2014-08-26 MED ORDER — DOCUSATE SODIUM 100 MG PO CAPS
100.0000 mg | ORAL_CAPSULE | Freq: Two times a day (BID) | ORAL | Status: DC
Start: 2014-08-26 — End: 2015-11-11

## 2014-08-26 MED ORDER — TRAMADOL HCL 50 MG PO TABS: 50.0000 mg | ORAL_TABLET | Freq: Four times a day (QID) | ORAL | Status: DC

## 2014-08-26 MED ORDER — POLYETHYLENE GLYCOL 3350 17 G PO PACK: 17.0000 g | PACK | Freq: Every day | ORAL | Status: DC

## 2014-08-26 MED ORDER — POTASSIUM CHLORIDE CRYS ER 20 MEQ PO TBCR: 20.0000 meq | EXTENDED_RELEASE_TABLET | Freq: Every day | ORAL | Status: DC

## 2014-08-26 MED ORDER — OXYCODONE HCL 5 MG PO TABS: 5.0000 mg | ORAL_TABLET | ORAL | Status: DC | PRN

## 2014-08-26 NOTE — Progress Notes (Signed)
Discharge orders received. Pt and wife educated on d/c instructions and verbalized understanding. Given d/c packet and prescriptions. IV removed. Pt and wife stated that they did not want to wait any longer on rolling walker and that they would purchase one on their own. Left voicemail notifying trauma CM. Pt taken downstairs by staff via wheelchair.

## 2014-08-26 NOTE — Discharge Instructions (Signed)
Colonic ileus Continue to take miralax, colace, lots of fiber. You are on potassium which help you bowels work.  Dr. Oletta Lamas will see you in 1 month and decide if you can come off of this. You are on reglan which also help your bowel function.  The gastroenterologist will decide when you can stop taking this.  Rib/sternal fracture Takes 6-8 weeks to heal on their own You can take ibuprofen, tramadol and oxycodone.  Once your pain improves, start reducing your pain medication.  You should be off narcotics within 2-3 weeks.  Metoclopramide tablets What is this medicine? METOCLOPRAMIDE (met oh kloe PRA mide) is used to treat the symptoms of gastroesophageal reflux disease (GERD) like heartburn. It is also used to treat people with slow emptying of the stomach and intestinal tract. This medicine may be used for other purposes; ask your health care provider or pharmacist if you have questions. COMMON BRAND NAME(S): Reglan What should I tell my health care provider before I take this medicine? They need to know if you have any of these conditions: -breast cancer -depression -diabetes -heart failure -high blood pressure -kidney disease -liver disease -Parkinson's disease or a movement disorder -pheochromocytoma -seizures -stomach obstruction, bleeding, or perforation -an unusual or allergic reaction to metoclopramide, procainamide, sulfites, other medicines, foods, dyes, or preservatives -pregnant or trying to get pregnant -breast-feeding How should I use this medicine? Take this medicine by mouth with a glass of water. Follow the directions on the prescription label. Take this medicine on an empty stomach, about 30 minutes before eating. Take your doses at regular intervals. Do not take your medicine more often than directed. Do not stop taking except on the advice of your doctor or health care professional. A special MedGuide will be given to you by the pharmacist with each prescription  and refill. Be sure to read this information carefully each time. Talk to your pediatrician regarding the use of this medicine in children. Special care may be needed. Overdosage: If you think you have taken too much of this medicine contact a poison control center or emergency room at once. NOTE: This medicine is only for you. Do not share this medicine with others. What if I miss a dose? If you miss a dose, take it as soon as you can. If it is almost time for your next dose, take only that dose. Do not take double or extra doses. What may interact with this medicine? -acetaminophen -cyclosporine -digoxin -medicines for blood pressure -medicines for diabetes, including insulin -medicines for hay fever and other allergies -medicines for depression, especially an Monoamine Oxidase Inhibitor (MAOI) -medicines for Parkinson's disease, like levodopa -medicines for sleep or for pain -tetracycline This list may not describe all possible interactions. Give your health care provider a list of all the medicines, herbs, non-prescription drugs, or dietary supplements you use. Also tell them if you smoke, drink alcohol, or use illegal drugs. Some items may interact with your medicine. What should I watch for while using this medicine? It may take a few weeks for your stomach condition to start to get better. However, do not take this medicine for longer than 12 weeks. The longer you take this medicine, and the more you take it, the greater your chances are of developing serious side effects. If you are an elderly patient, a male patient, or you have diabetes, you may be at an increased risk for side effects from this medicine. Contact your doctor immediately if you start having movements you  cannot control such as lip smacking, rapid movements of the tongue, involuntary or uncontrollable movements of the eyes, head, arms and legs, or muscle twitches and spasms. Patients and their families should watch out  for worsening depression or thoughts of suicide. Also watch out for any sudden or severe changes in feelings such as feeling anxious, agitated, panicky, irritable, hostile, aggressive, impulsive, severely restless, overly excited and hyperactive, or not being able to sleep. If this happens, especially at the beginning of treatment or after a change in dose, call your doctor. Do not treat yourself for high fever. Ask your doctor or health care professional for advice. You may get drowsy or dizzy. Do not drive, use machinery, or do anything that needs mental alertness until you know how this drug affects you. Do not stand or sit up quickly, especially if you are an older patient. This reduces the risk of dizzy or fainting spells. Alcohol can make you more drowsy and dizzy. Avoid alcoholic drinks. What side effects may I notice from receiving this medicine? Side effects that you should report to your doctor or health care professional as soon as possible: -allergic reactions like skin rash, itching or hives, swelling of the face, lips, or tongue -abnormal production of milk in females -breast enlargement in both males and females -change in the way you walk -difficulty moving, speaking or swallowing -drooling, lip smacking, or rapid movements of the tongue -excessive sweating -fever -involuntary or uncontrollable movements of the eyes, head, arms and legs -irregular heartbeat or palpitations -muscle twitches and spasms -unusually weak or tired Side effects that usually do not require medical attention (report to your doctor or health care professional if they continue or are bothersome): -change in sex drive or performance -depressed mood -diarrhea -difficulty sleeping -headache -menstrual changes -restless or nervous This list may not describe all possible side effects. Call your doctor for medical advice about side effects. You may report side effects to FDA at 1-800-FDA-1088. Where should I  keep my medicine? Keep out of the reach of children. Store at room temperature between 20 and 25 degrees C (68 and 77 degrees F). Protect from light. Keep container tightly closed. Throw away any unused medicine after the expiration date. NOTE: This sheet is a summary. It may not cover all possible information. If you have questions about this medicine, talk to your doctor, pharmacist, or health care provider.  2015, [employer]. (2011-06-26 13:04:38)

## 2014-08-26 NOTE — Discharge Summary (Signed)
Physician Discharge Summary  Kenneth Leach KZS:[id number] DOB: 20-Dec-1955 DOA: 08/18/2014  PCP: No primary care provider on file.  Consultation: GI---Dr. Laurence Spates  Admit date: 08/18/2014 Discharge date: 08/26/2014  Recommendations for Outpatient Follow-up:   Follow-up Information    Follow up with [name],[name], MD. Schedule an appointment as soon as possible for a visit in 1 month.   Specialty:  Gastroenterology   Contact information:   [address] [place] [address] [phone]       Follow up with [name].   Why:  As needed   Contact information:   [address] [id number] [place] [phone]     Discharge Diagnoses:  1. Fall from ladder 2. Rib fractures 3. Sternal fracture 4. ABL anemia 5. Multiple  Contusions 6. Colonic ileus   Surgical Procedure: none  Discharge Condition: stable Disposition: home  Diet recommendation: high fiber  Filed Weights   08/18/14 2102  Weight: 104 kg (229 lb 4.5 oz)     Filed Vitals:   08/26/14 0606  BP: 126/79  Pulse: 78  Temp: 98.2 F (36.8 C)  Resp: 22     Hospital Course:  Kenneth Leach presented to [facility] following a fall.  He was found to have multiple injuries including a sternal fracture, rib fracture, and contusions.  He was admitted for pain control.  He developed an ileus and was given neostigmine, added reglan and GI was consulted.   He had ABL anemia, CT Of A/P did not demonstrate and acute injuries.  He also had RLE dopplers which were negative for DVT.  Follow up abdominal films did not show an obstruction and the patient continued to have bowel movements.  Pain was managed.  He was mobilized with therapies.  Narcotics were reduced to help with the ileus.  On HD#8 the patient was felt stable for discharge home with reglan, potassium and follow up with GI in 1 month.  Medication risks, benefits and therapeutic  alternatives were reviewed with the patient.  He verbalizes understanding.  He knows to start reducing his dose as pain improves to wean off within the next few weeks.  He was encouraged to call with questions or concerns.    PE: General appearance: alert, cooperative and no distress Resp: clear to auscultation bilaterally Cardio: regular rate and rhythm, S1, S2 normal, no murmur, click, rub or gallop GI: +bs, abdomen is less distended, soft, non tender. Extremities: extremities normal, atraumatic, no cyanosis or edema   Discharge Instructions     Medication List    TAKE these medications        docusate sodium 100 MG capsule  Commonly known as:  COLACE  Take 1 capsule (100 mg total) by mouth 2 (two) times daily.     ibuprofen 200 MG tablet  Commonly known as:  ADVIL,MOTRIN  Take 200-400 mg by mouth every 6 (six) hours as needed for moderate pain.     metoCLOPramide 5 MG tablet  Commonly known as:  REGLAN  Take 1 tablet (5 mg total) by mouth 4 (four) times daily -  before meals and at bedtime.     omeprazole 20 MG capsule  Commonly known as:  PRILOSEC  Take 20 mg by mouth daily.     oxyCODONE 5 MG immediate release tablet  Commonly known as:  Oxy IR/ROXICODONE  Take 1-2 tablets (5-10 mg total) by mouth every 4 (four) hours as needed (10mg  for  mild pain, 15mg  for moderate pain, 20mg  for severe pain).     polyethylene glycol packet  Commonly known as:  MIRALAX / GLYCOLAX  Take 17 g by mouth daily.     potassium chloride SA 20 MEQ tablet  Commonly known as:  K-DUR,KLOR-CON  Take 1 tablet (20 mEq total) by mouth daily.     simvastatin 40 MG tablet  Commonly known as:  ZOCOR  Take 40 mg by mouth daily.     SYNTHROID 100 MCG tablet  Generic drug:  levothyroxine  Take 100 mcg by mouth daily.     traMADol 50 MG tablet  Commonly known as:  ULTRAM  Take 1 tablet (50 mg total) by mouth every 6 (six) hours.           Follow-up Information    Follow up with EDWARDS  JR,[name], MD. Schedule an appointment as soon as possible for a visit in 1 month.   Specialty:  Gastroenterology   Contact information:   [address] [address] [phone]       Follow up with [facility].   Why:  As needed   Contact information:   [address] [id number] [place] [phone]       The results of significant diagnostics from this hospitalization (including imaging, microbiology, ancillary and laboratory) are listed below for reference.    Significant Diagnostic Studies: Dg Ribs Unilateral W/chest Right  08/18/2014   CLINICAL DATA:  Fell 15 feet from ladder 2 hours ago.  EXAM: RIGHT RIBS AND CHEST - 3+ VIEW  COMPARISON:  None.  FINDINGS: There is cardiomegaly. Low lung volumes with bibasilar opacities, likely atelectasis. No effusions or pneumothorax. Prominence of the superior mediastinum with deviation of the trachea to the right. This could be related to substernal goiter or vasculature. Cannot exclude aortic aneurysm or other mediastinal mass.  There is a nondisplaced right lateral tenth rib fracture.  IMPRESSION: Nondisplaced right lateral tenth rib fracture. Low lung volumes with bibasilar atelectasis. No pneumothorax.  Cardiomegaly. Prominence of the superior mediastinum with deviation of the trachea to the right could be related to aortic aneurysm, substernal thyroid goiter, or other mediastinal mass.   Electronically Signed   By: Rolm Baptise M.D.   On: 08/18/2014 12:46   Dg Elbow Complete Right  08/18/2014   CLINICAL DATA:  59 year old male fell 15 feet off of a ladder 2 hours ago with pain. Initial encounter.  EXAM: RIGHT ELBOW - COMPLETE 3+ VIEW  COMPARISON:  None.  FINDINGS: IV access artifact. The lateral view is oblique. No right elbow joint effusion is evident. Joint spaces and alignment are within normal limits. The radial head appears intact. No acute fracture  or dislocation identified.  IMPRESSION: No acute fracture or dislocation identified about the right elbow.   Electronically Signed   By: Genevie Ann M.D.   On: 08/18/2014 12:42   Dg Abd 1 View  08/20/2014   CLINICAL DATA:  Ileus  EXAM: ABDOMEN - 1 VIEW  COMPARISON:  CT 08/18/2014  FINDINGS: Gaseous distention of predominately the colon, likely ileus. Gas within nondistended small bowel loops and stomach. No free air. No organomegaly or suspicious calcification.  IMPRESSION: Diffuse gaseous distention of the colon, likely ileus.   Electronically Signed   By: Rolm Baptise M.D.   On: 08/20/2014 10:46   Ct Head Wo Contrast  08/18/2014   CLINICAL DATA:  Fall. 15 foot  fall from ladder. Cervical Spine immobilized.  EXAM: CT HEAD WITHOUT CONTRAST  CT CERVICAL SPINE WITHOUT CONTRAST  TECHNIQUE: Multidetector CT imaging of the head and cervical spine was performed following the standard protocol without intravenous contrast. Multiplanar CT image reconstructions of the cervical spine were also generated.  COMPARISON:  None.  FINDINGS: CT HEAD FINDINGS  No mass lesion, mass effect, midline shift, hydrocephalus, hemorrhage. No territorial ischemia or acute infarction. The calvarium is intact. Tiny RIGHT mastoid effusion is present. Skull base appears intact. Visible paranasal sinuses are normal.  CT CERVICAL SPINE FINDINGS  Alignment: Normal.  Craniocervical junction: Odontoid intact. Occipital condyles are normal. The C1 ring is normal.  Vertebrae: Negative for fracture. Degenerative endplate changes from C5 through C7.  Paraspinal soft tissues: Normal.  Lung apices: Ground-glass attenuation in the lung apices. Given the distribution, this may represent edema or atelectasis. Correlating with the chest radiograph, this probably represents edema given the cardiomegaly.  Severe disc degeneration at C5-C6 and C6-C7 with endplate sclerosis and uncovertebral spurring. Foraminal stenosis is most pronounced at C5-C6.  There is a small  cortical defect in the anterior sternal manubrium on the axial images. This does not extend through the posterior cortex and probably represents a vascular channel rather than sternal fracture. Clinically correlate for tenderness of the sternal manubrium.  IMPRESSION: 1. Negative CT head. 2. Moderate mid to lower cervical spondylosis. No cervical spine fracture or dislocation. 3. Ground-glass attenuation at the lung apices likely representing combination of atelectasis and edema. 4. Tiny anterior cortical lucency in the sternal manubrium, extending from RIGHT to LEFT as it extends superiorly. Vascular channel favored over fracture. Correlate for tenderness.   Electronically Signed   By: Dereck Ligas M.D.   On: 08/18/2014 13:30   Ct Chest W Contrast  08/18/2014   CLINICAL DATA:  15 foot fall from ladder onto air conditioner. Right hip abrasion.  EXAM: CT CHEST, ABDOMEN, AND PELVIS WITH CONTRAST  TECHNIQUE: Multidetector CT imaging of the chest, abdomen and pelvis was performed following the standard protocol during bolus administration of intravenous contrast.  CONTRAST:  155mL OMNIPAQUE IOHEXOL 300 MG/ML  SOLN  COMPARISON:  None.  FINDINGS: CT CHEST FINDINGS  Mediastinum/Nodes: There is no contour abnormality of the ascending, transverse, or descending thoracic aorta to suggest dissection or transsection. Great vessels normal.  There is a small amount of hemorrhage within the anterior mediastinum on the right (image 32, series 2). This is seen on coronal image 45, series 5 additionally. This mediastinum collection of blood measures 3.5 x 1.4 cm on image 32, series 2.  There is a fracture of the anterior second rib which is nondisplaced (image 30, series 2). This is in the vicinity of the mediastinal hemorrhage. There is a probable nondisplaced fracture of the right aspect of the manubrium (image 29, series 3). Also in the vicinity of the mediastinal hemorrhage.  Lungs/Pleura: No pneumothorax. There is bibasilar  atelectasis. No pulmonary contusion or pleural fluid. Mild atelectasis along descending aorta.  CT ABDOMEN AND PELVIS FINDINGS  Hepatobiliary: Left hepatic lobe measures 3 mm is too small to characterize. No evidence of hepatic the bladder is intact without evidence of injury. Delayed imaging through the kidneys demonstrates no collecting system injury.  .  Stomach/Bowel: Stomach, small bowel, appendix, and cecum are normal. The colon and rectosigmoid colon are normal.  Vascular/Lymphatic: Abdominal aorta is normal caliber. There is no retroperitoneal or periportal lymphadenopathy. No pelvic lymphadenopathy.  Reproductive: Normal prostate small right inguinal hernia.  Musculoskeletal: No evidence of pelvic fracture. No evidence of spine fracture.  Other: No free fluid in the abdomen.  IMPRESSION: Chest Impression:  1. Small amount of mediastinal hematoma likely relates to venous hemorrhage from associated nondisplaced manubrial fracture and right anterior second rib fracture. Recommend close observation and if patient develops chest pain or changes in hemodynamics, recommend gated CTA of the thorax to evaluate aorta. 2. Currently no evidence of aortic injury. 3. No pneumothorax. 4. Right second rib fracture and nondisplaced manubrial fracture as above.  Abdomen / Pelvis Impression:  1. No evidence solid organ injury in the abdomen pelvis. 2. No evidence of this pelvic fracture or spine fracture. Findings conveyed toBeaton, MDon 08/18/2014  at18:28.   Electronically Signed   By: Suzy Bouchard M.D.   On: 08/18/2014 18:30   Ct Cervical Spine Wo Contrast  08/18/2014   CLINICAL DATA:  Fall. 15 foot fall from ladder. Cervical Spine immobilized.  EXAM: CT HEAD WITHOUT CONTRAST  CT CERVICAL SPINE WITHOUT CONTRAST  TECHNIQUE: Multidetector CT imaging of the head and cervical spine was performed following the standard protocol without intravenous contrast. Multiplanar CT image reconstructions of the cervical spine were  also generated.  COMPARISON:  None.  FINDINGS: CT HEAD FINDINGS  No mass lesion, mass effect, midline shift, hydrocephalus, hemorrhage. No territorial ischemia or acute infarction. The calvarium is intact. Tiny RIGHT mastoid effusion is present. Skull base appears intact. Visible paranasal sinuses are normal.  CT CERVICAL SPINE FINDINGS  Alignment: Normal.  Craniocervical junction: Odontoid intact. Occipital condyles are normal. The C1 ring is normal.  Vertebrae: Negative for fracture. Degenerative endplate changes from C5 through C7.  Paraspinal soft tissues: Normal.  Lung apices: Ground-glass attenuation in the lung apices. Given the distribution, this may represent edema or atelectasis. Correlating with the chest radiograph, this probably represents edema given the cardiomegaly.  Severe disc degeneration at C5-C6 and C6-C7 with endplate sclerosis and uncovertebral spurring. Foraminal stenosis is most pronounced at C5-C6.  There is a small cortical defect in the anterior sternal manubrium on the axial images. This does not extend through the posterior cortex and probably represents a vascular channel rather than sternal fracture. Clinically correlate for tenderness of the sternal manubrium.  IMPRESSION: 1. Negative CT head. 2. Moderate mid to lower cervical spondylosis. No cervical spine fracture or dislocation. 3. Ground-glass attenuation at the lung apices likely representing combination of atelectasis and edema. 4. Tiny anterior cortical lucency in the sternal manubrium, extending from RIGHT to LEFT as it extends superiorly. Vascular channel favored over fracture. Correlate for tenderness.   Electronically Signed   By: Dereck Ligas M.D.   On: 08/18/2014 13:30   Ct Abdomen Pelvis W Contrast  08/23/2014   CLINICAL DATA:  Status post 15 foot fall 08/18/2014. Abdominal pain and bloating.  EXAM: CT ABDOMEN AND PELVIS WITH CONTRAST  TECHNIQUE: Multidetector CT imaging of the abdomen and pelvis was performed  using the standard protocol following bolus administration of intravenous contrast.  CONTRAST:  100 mL OMNIPAQUE IOHEXOL 300 MG/ML  SOLN  COMPARISON:  CT chest, abdomen and pelvis 08/18/2014.  FINDINGS: The patient has a new small right pleural effusion. No left pleural effusion or pericardial effusion is identified. Heart size is upper normal. Dependent atelectasis is present in the lung bases, worse on the right.  The gallbladder, liver, adrenal glands, spleen, pancreas and kidneys appear normal.  Oral contrast is seen throughout the small bowel and in the ascending colon. The small bowel is normal in  caliber. There is diffuse mild to moderate distention of the colon most consistent with colonic ileus. A small volume of liquid stool is seen in the descending colon with formed stool in the ascending colon noted. Small fat containing inguinal hernias are identified, larger on the left. There is a very small volume of free pelvic fluid. No lymphadenopathy is identified.  There is infiltration of subcutaneous fat about the right side of the pelvis and upper leg with an appearance most consistent with contusion/hematoma. Right ninth and tenth rib fractures are identified. No other focal bony abnormality is seen.  IMPRESSION: Mild to moderate gaseous distention of the colon throughout is most consistent with ileus. There is no evidence of bowel obstruction.  Right ninth and tenth rib fractures are identified. These are not visible on the comparison CT scan.  Small right pleural effusion and bibasilar atelectasis, greater on the right.  Hematoma/contusion in subcutaneous fat about the right hip and pelvis is new since the prior examination.  Small fat containing bilateral inguinal hernias, larger on the right.   Electronically Signed   By: Inge Rise M.D.   On: 08/23/2014 14:18   Ct Abdomen Pelvis W Contrast  08/18/2014   CLINICAL DATA:  15 foot fall from ladder onto air conditioner. Right hip abrasion.  EXAM: CT  CHEST, ABDOMEN, AND PELVIS WITH CONTRAST  TECHNIQUE: Multidetector CT imaging of the chest, abdomen and pelvis was performed following the standard protocol during bolus administration of intravenous contrast.  CONTRAST:  146mL OMNIPAQUE IOHEXOL 300 MG/ML  SOLN  COMPARISON:  None.  FINDINGS: CT CHEST FINDINGS  Mediastinum/Nodes: There is no contour abnormality of the ascending, transverse, or descending thoracic aorta to suggest dissection or transsection. Great vessels normal.  There is a small amount of hemorrhage within the anterior mediastinum on the right (image 32, series 2). This is seen on coronal image 45, series 5 additionally. This mediastinum collection of blood measures 3.5 x 1.4 cm on image 32, series 2.  There is a fracture of the anterior second rib which is nondisplaced (image 30, series 2). This is in the vicinity of the mediastinal hemorrhage. There is a probable nondisplaced fracture of the right aspect of the manubrium (image 29, series 3). Also in the vicinity of the mediastinal hemorrhage.  Lungs/Pleura: No pneumothorax. There is bibasilar atelectasis. No pulmonary contusion or pleural fluid. Mild atelectasis along descending aorta.  CT ABDOMEN AND PELVIS FINDINGS  Hepatobiliary: Left hepatic lobe measures 3 mm is too small to characterize. No evidence of hepatic the bladder is intact without evidence of injury. Delayed imaging through the kidneys demonstrates no collecting system injury.  .  Stomach/Bowel: Stomach, small bowel, appendix, and cecum are normal. The colon and rectosigmoid colon are normal.  Vascular/Lymphatic: Abdominal aorta is normal caliber. There is no retroperitoneal or periportal lymphadenopathy. No pelvic lymphadenopathy.  Reproductive: Normal prostate small right inguinal hernia.  Musculoskeletal: No evidence of pelvic fracture. No evidence of spine fracture.  Other: No free fluid in the abdomen.  IMPRESSION: Chest Impression:  1. Small amount of mediastinal hematoma  likely relates to venous hemorrhage from associated nondisplaced manubrial fracture and right anterior second rib fracture. Recommend close observation and if patient develops chest pain or changes in hemodynamics, recommend gated CTA of the thorax to evaluate aorta. 2. Currently no evidence of aortic injury. 3. No pneumothorax. 4. Right second rib fracture and nondisplaced manubrial fracture as above.  Abdomen / Pelvis Impression:  1. No evidence solid organ injury in  the abdomen pelvis. 2. No evidence of this pelvic fracture or spine fracture. Findings conveyed toBeaton, MDon 08/18/2014  at18:28.   Electronically Signed   By: Suzy Bouchard M.D.   On: 08/18/2014 18:30   Dg Tibia/fibula Right Port  08/23/2014   CLINICAL DATA:  59 year old male with posterior tib-fib region pain and tenderness. Initial encounter.  EXAM: PORTABLE RIGHT TIBIA AND FIBULA - 2 VIEW  COMPARISON:  None.  FINDINGS: Portable supine AP and cross-table lateral views of the right tib-fib. Alignment at the right knee and ankle appears preserved. There is a small suprapatellar joint effusion. Bone mineralization appears normal throughout. No subcutaneous gas. There is evidence of subcutaneous stranding greater along the lateral aspect. No radiopaque foreign body identified. No acute osseous abnormality identified.  IMPRESSION: 1.  No acute osseous abnormality identified about the right tib-fib. 2. Small right knee joint effusion. Nonspecific soft tissue stranding, greater laterally.   Electronically Signed   By: Genevie Ann M.D.   On: 08/23/2014 09:41   Dg Abd 2 Views  08/25/2014   CLINICAL DATA:  Abdominal distention, ileus  EXAM: ABDOMEN - 2 VIEW  COMPARISON:  CT abdomen pelvis dated 08/23/2014  FINDINGS: Nonobstructive bowel gas pattern. No evidence of free air under the diaphragm on the upright view.  Moderate gaseous distention of the nondependent transverse colon.  IMPRESSION: No evidence of small bowel obstruction or free air.    Electronically Signed   By: Julian Hy M.D.   On: 08/25/2014 14:51   Dg Abd Portable 1v  08/23/2014   CLINICAL DATA:  Abdominal pain and distention. Ileus. Rib and sternal fractures from fall.  EXAM: PORTABLE ABDOMEN - 1 VIEW  COMPARISON:  None.  FINDINGS: Mild diffuse gaseous distention of the colon shows no significant change. Gas-filled nondilated small bowel loops again seen. These findings remain consistent with an ileus. No evidence of dilated small bowel loops.  IMPRESSION: Mild ileus pattern, without significant change.   Electronically Signed   By: Earle Gell M.D.   On: 08/23/2014 07:54   Dg Abd Portable 1v  08/22/2014   CLINICAL DATA:  Ileus.  Bloating.  Diarrhea.  EXAM: PORTABLE ABDOMEN - 1 VIEW  COMPARISON:  08/21/2014.  08/20/2014.  CT 08/18/2014.  FINDINGS: There continues to be large amount a gas within the colon. There is also some gas in nondilated distal small bowel. The pattern remains most consistent with ileus. No sign of free air on the supine images. No abnormal calcifications are bony findings.  IMPRESSION: Persistent ileus pattern with gas-filled distal small bowel and colon.   Electronically Signed   By: Nelson Chimes M.D.   On: 08/22/2014 09:00   Dg Abd Portable 1v  08/21/2014   CLINICAL DATA:  Ileus. Abdominal bloating and right-sided abdominal pain.  EXAM: PORTABLE ABDOMEN - 1 VIEW  COMPARISON:  08/20/2014  FINDINGS: There is persistent mild to moderate gaseous distension of the colon, not significantly changed. Gas is present in multiple nondilated small bowel loops. No gross intraperitoneal free air on limited supine imaging. No acute osseous abnormality or abnormal calcification.  IMPRESSION: Unchanged gaseous distention of the colon suggestive of ileus.   Electronically Signed   By: Logan Bores   On: 08/21/2014 12:26   Dg Femur, Min 2 Views Right  08/18/2014   CLINICAL DATA:  59 year old male who fell 15 feet off of a ladder 2 hours ago. Pain. Initial encounter.   EXAM: RIGHT FEMUR 2 VIEWS  COMPARISON:  None.  FINDINGS:  Mild spine board artifact. The right femoral head is normally located. The visible right hemipelvis appears intact. The proximal right femur is intact. The right femoral shaft is intact. The distal right femur appears intact with grossly normal alignment at the right knee.  IMPRESSION: No acute fracture or dislocation identified about the right femur.   Electronically Signed   By: Genevie Ann M.D.   On: 08/18/2014 12:41    Microbiology: No results found for this or any previous visit (from the past 240 hour(s)).   Labs: Basic Metabolic Panel:  Recent Labs Lab 08/21/14 1020 08/23/14 0301 08/24/14 0808 08/25/14 0400  NA 135 132* 134* 132*  K 4.0 3.8 3.7 4.4  CL 98* 99* 101 102  CO2 24 23 24 22   GLUCOSE 115* 105* 99 93  BUN 12 21* 13 12  CREATININE 0.82 0.85 0.80 0.77  CALCIUM 9.0 8.5* 9.0 8.9  MG  --  1.8  --   --    Liver Function Tests: No results for input(s): AST, ALT, ALKPHOS, BILITOT, PROT, ALBUMIN in the last 168 hours. No results for input(s): LIPASE, AMYLASE in the last 168 hours. No results for input(s): AMMONIA in the last 168 hours. CBC:  Recent Labs Lab 08/21/14 1020 08/23/14 0301 08/24/14 0558  WBC 10.5 8.7 6.9  HGB 12.5* 10.2* 10.6*  HCT 37.0* 30.1* 31.4*  MCV 90.2 89.1 89.2  PLT 211 227 237   Cardiac Enzymes: No results for input(s): CKTOTAL, CKMB, CKMBINDEX, TROPONINI in the last 168 hours. BNP: BNP (last 3 results) No results for input(s): BNP in the last 8760 hours.  ProBNP (last 3 results) No results for input(s): PROBNP in the last 8760 hours.  CBG:  Recent Labs Lab 08/23/14 0735  GLUCAP 85    Active Problems:   Mediastinal hematoma   Fall from ladder   Sternal fracture   Right rib fracture   Ileus   Multiple contusions   Thigh hematoma   Acute blood loss anemia   Time coordinating discharge: <30 mins  Signed:  Leanette Eutsler, ANP-BC

## 2015-04-06 DIAGNOSIS — E785 Hyperlipidemia, unspecified: Secondary | ICD-10-CM | POA: Insufficient documentation

## 2015-04-06 DIAGNOSIS — K219 Gastro-esophageal reflux disease without esophagitis: Secondary | ICD-10-CM | POA: Insufficient documentation

## 2015-10-04 ENCOUNTER — Other Ambulatory Visit: Payer: Self-pay | Admitting: Family Medicine

## 2015-10-04 ENCOUNTER — Encounter: Payer: Self-pay | Admitting: Gastroenterology

## 2015-10-04 DIAGNOSIS — E7800 Pure hypercholesterolemia, unspecified: Secondary | ICD-10-CM | POA: Diagnosis not present

## 2015-10-04 DIAGNOSIS — E041 Nontoxic single thyroid nodule: Secondary | ICD-10-CM | POA: Diagnosis not present

## 2015-10-04 DIAGNOSIS — K921 Melena: Secondary | ICD-10-CM | POA: Diagnosis not present

## 2015-10-04 DIAGNOSIS — E039 Hypothyroidism, unspecified: Secondary | ICD-10-CM | POA: Diagnosis not present

## 2015-10-05 ENCOUNTER — Encounter: Payer: Self-pay | Admitting: Gastroenterology

## 2015-10-11 ENCOUNTER — Ambulatory Visit
Admission: RE | Admit: 2015-10-11 | Discharge: 2015-10-11 | Disposition: A | Discharge status: Home / Self Care | Payer: BLUE CROSS/BLUE SHIELD | Source: Ambulatory Visit | Attending: Family Medicine | Admitting: Family Medicine

## 2015-10-11 DIAGNOSIS — E042 Nontoxic multinodular goiter: Secondary | ICD-10-CM | POA: Diagnosis not present

## 2015-10-11 DIAGNOSIS — E041 Nontoxic single thyroid nodule: Secondary | ICD-10-CM

## 2015-10-24 DIAGNOSIS — K4090 Unilateral inguinal hernia, without obstruction or gangrene, not specified as recurrent: Secondary | ICD-10-CM | POA: Diagnosis not present

## 2015-11-03 ENCOUNTER — Ambulatory Visit: Payer: Self-pay | Admitting: General Surgery

## 2015-11-11 ENCOUNTER — Ambulatory Visit (AMBULATORY_SURGERY_CENTER): Payer: Self-pay | Admitting: *Deleted

## 2015-11-11 VITALS — Ht 72.0 in | Wt 216.0 lb

## 2015-11-11 DIAGNOSIS — Z1211 Encounter for screening for malignant neoplasm of colon: Secondary | ICD-10-CM

## 2015-11-11 DIAGNOSIS — Z800 Family history of malignant neoplasm of digestive organs: Secondary | ICD-10-CM

## 2015-11-11 MED ORDER — NA SULFATE-K SULFATE-MG SULF 17.5-3.13-1.6 GM/177ML PO SOLN: 1.0000 | Freq: Once | ORAL | 0 refills | Status: AC

## 2015-11-11 NOTE — Progress Notes (Signed)
No egg or soy allergy known to patient  No issues with past sedation with any surgeries  or procedures, no intubation problems  No diet pills per patient No home 02 use per patient  No blood thinners per patient  Pt denies issues with constipation  No A fib or A flutter   

## 2015-11-23 ENCOUNTER — Encounter: Payer: Self-pay | Admitting: Gastroenterology

## 2015-11-28 NOTE — Pre-Procedure Instructions (Signed)
Kenneth Leach  11/28/2015     Your procedure is scheduled on : Tuesday December 06, 2015 at 7:15 AM.  Report to [facility] Admitting at 5:30 AM.  Call this number if you have problems the morning of surgery: [phone]    Remember:  Do not eat food or drink liquids after midnight.  Take these medicines the morning of surgery with A SIP OF WATER : Omeprazole (Prilosec), Synthroid   Stop taking any vitamins, herbal medications/supplements, NSAIDs, Ibuprofen, Advil, Motrin, Aleve, etc today   Do not wear jewelry.  Do not wear lotions, powders, or cologne, or deoderant.  Men may shave face and neck.  Do not bring valuables to the hospital.  [facility] is not responsible for any belongings or valuables.  Contacts, dentures or bridgework may not be worn into surgery.  Leave your suitcase in the car.  After surgery it may be brought to your room.  For patients admitted to the hospital, discharge time will be determined by your treatment team.  Patients discharged the day of surgery will not be allowed to drive home.   Name and phone number of your driver:    Special instructions:  Shower using CHG soap the night before and the morning of your surgery  Please read over the following fact sheets that you were given.

## 2015-11-29 ENCOUNTER — Encounter (HOSPITAL_COMMUNITY): Payer: Self-pay

## 2015-11-29 ENCOUNTER — Encounter (HOSPITAL_COMMUNITY)
Admission: RE | Admit: 2015-11-29 | Discharge: 2015-11-29 | Disposition: A | Discharge status: Home / Self Care | Payer: BLUE CROSS/BLUE SHIELD | Source: Ambulatory Visit | Attending: General Surgery | Admitting: General Surgery

## 2015-11-29 ENCOUNTER — Ambulatory Visit (HOSPITAL_COMMUNITY)
Admission: RE | Admit: 2015-11-29 | Discharge: 2015-11-29 | Disposition: A | Discharge status: Home / Self Care | Payer: BLUE CROSS/BLUE SHIELD | Source: Ambulatory Visit | Attending: General Surgery | Admitting: General Surgery

## 2015-11-29 DIAGNOSIS — Z01818 Encounter for other preprocedural examination: Secondary | ICD-10-CM

## 2015-11-29 DIAGNOSIS — R9431 Abnormal electrocardiogram [ECG] [EKG]: Secondary | ICD-10-CM | POA: Insufficient documentation

## 2015-11-29 DIAGNOSIS — Z01810 Encounter for preprocedural cardiovascular examination: Secondary | ICD-10-CM | POA: Insufficient documentation

## 2015-11-29 HISTORY — DX: Hypothyroidism, unspecified: E03.9

## 2015-11-29 LAB — CBC WITH DIFFERENTIAL/PLATELET
BASOS ABS: 0 10*3/uL (ref 0.0–0.1)
BASOS PCT: 0 %
Eosinophils Absolute: 0.4 10*3/uL (ref 0.0–0.7)
Eosinophils Relative: 6 %
HEMATOCRIT: 49.3 % (ref 39.0–52.0)
Hemoglobin: 16.3 g/dL (ref 13.0–17.0)
Lymphocytes Relative: 30 %
Lymphs Abs: 2.2 10*3/uL (ref 0.7–4.0)
MCH: 30.2 pg (ref 26.0–34.0)
MCHC: 33.1 g/dL (ref 30.0–36.0)
MCV: 91.3 fL (ref 78.0–100.0)
MONO ABS: 0.5 10*3/uL (ref 0.1–1.0)
Monocytes Relative: 7 %
NEUTROS ABS: 4.2 10*3/uL (ref 1.7–7.7)
NEUTROS PCT: 57 %
PLATELETS: 207 10*3/uL (ref 150–400)
RBC: 5.4 MIL/uL (ref 4.22–5.81)
RDW: 13.4 % (ref 11.5–15.5)
WBC: 7.4 10*3/uL (ref 4.0–10.5)

## 2015-11-29 LAB — BASIC METABOLIC PANEL
ANION GAP: 9 (ref 5–15)
BUN: 10 mg/dL (ref 6–20)
CALCIUM: 9.4 mg/dL (ref 8.9–10.3)
CO2: 22 mmol/L (ref 22–32)
Chloride: 106 mmol/L (ref 101–111)
Creatinine, Ser: 0.92 mg/dL (ref 0.61–1.24)
GLUCOSE: 95 mg/dL (ref 65–99)
Potassium: 4.4 mmol/L (ref 3.5–5.1)
SODIUM: 137 mmol/L (ref 135–145)

## 2015-11-29 MED ORDER — CHLORHEXIDINE GLUCONATE CLOTH 2 % EX PADS: 6.0000 | MEDICATED_PAD | Freq: Once | CUTANEOUS | Status: DC

## 2015-12-02 ENCOUNTER — Encounter: Payer: Self-pay | Admitting: Gastroenterology

## 2015-12-02 ENCOUNTER — Ambulatory Visit (AMBULATORY_SURGERY_CENTER): Payer: BLUE CROSS/BLUE SHIELD | Admitting: Gastroenterology

## 2015-12-02 VITALS — BP 127/83 | HR 51 | Temp 96.0°F | Resp 14 | Ht 72.0 in | Wt 211.0 lb

## 2015-12-02 DIAGNOSIS — D120 Benign neoplasm of cecum: Secondary | ICD-10-CM

## 2015-12-02 DIAGNOSIS — K635 Polyp of colon: Secondary | ICD-10-CM

## 2015-12-02 DIAGNOSIS — D123 Benign neoplasm of transverse colon: Secondary | ICD-10-CM

## 2015-12-02 DIAGNOSIS — Z1211 Encounter for screening for malignant neoplasm of colon: Secondary | ICD-10-CM | POA: Diagnosis not present

## 2015-12-02 DIAGNOSIS — D126 Benign neoplasm of colon, unspecified: Secondary | ICD-10-CM | POA: Diagnosis not present

## 2015-12-02 DIAGNOSIS — Z800 Family history of malignant neoplasm of digestive organs: Secondary | ICD-10-CM

## 2015-12-02 MED ORDER — SODIUM CHLORIDE 0.9 % IV SOLN: 500.0000 mL | INTRAVENOUS | Status: DC

## 2015-12-02 NOTE — Progress Notes (Signed)
Pt having hernia surgery 12-06-15

## 2015-12-02 NOTE — Progress Notes (Signed)
A/ox3 pleased with MAC, report to annette RN

## 2015-12-02 NOTE — Patient Instructions (Signed)
YOU HAD AN ENDOSCOPIC PROCEDURE TODAY AT [facility]:   Refer to the procedure report that was given to you for any specific questions about what was found during the examination.  If the procedure report does not answer your questions, please call your gastroenterologist to clarify.  If you requested that your care partner not be given the details of your procedure findings, then the procedure report has been included in a sealed envelope for you to review at your convenience later.  YOU SHOULD EXPECT: Some feelings of bloating in the abdomen. Passage of more gas than usual.  Walking can help get rid of the air that was put into your GI tract during the procedure and reduce the bloating. If you had a lower endoscopy (such as a colonoscopy or flexible sigmoidoscopy) you may notice spotting of blood in your stool or on the toilet paper. If you underwent a bowel prep for your procedure, you may not have a normal bowel movement for a few days.  Please Note:  You might notice some irritation and congestion in your nose or some drainage.  This is from the oxygen used during your procedure.  There is no need for concern and it should clear up in a day or so.  SYMPTOMS TO REPORT IMMEDIATELY:   Following lower endoscopy (colonoscopy or flexible sigmoidoscopy):  Excessive amounts of blood in the stool  Significant tenderness or worsening of abdominal pains  Swelling of the abdomen that is new, acute  Fever of 100F or higher   Following upper endoscopy (EGD)  Vomiting of blood or coffee ground material  New chest pain or pain under the shoulder blades  Painful or persistently difficult swallowing  New shortness of breath  Fever of 100F or higher  Black, tarry-looking stools  For urgent or emergent issues, a gastroenterologist can be reached at any hour by calling [phone].   DIET:  We do recommend a small meal at first, but then you may proceed to your regular diet.  Drink  plenty of fluids but you should avoid alcoholic beverages for 24 hours.  ACTIVITY:  You should plan to take it easy for the rest of today and you should NOT DRIVE or use heavy machinery until tomorrow (because of the sedation medicines used during the test).    FOLLOW UP: Our staff will call the number listed on your records the next business day following your procedure to check on you and address any questions or concerns that you may have regarding the information given to you following your procedure. If we do not reach you, we will leave a message.  However, if you are feeling well and you are not experiencing any problems, there is no need to return our call.  We will assume that you have returned to your regular daily activities without incident.  If any biopsies were taken you will be contacted by phone or by letter within the next 1-3 weeks.  Please call us at [phone] if you have not heard about the biopsies in 3 weeks.    SIGNATURES/CONFIDENTIALITY: You and/or your care partner have signed paperwork which will be entered into your electronic medical record.  These signatures attest to the fact that that the information above on your After Visit Summary has been reviewed and is understood.  Full responsibility of the confidentiality of this discharge information lies with you and/or your care-partner.   Handouts were given to your care partner on polyps and  and hemorrhoids. You may resume your current medications today. Await biopsy results. Please call if any questions or concerns.   

## 2015-12-02 NOTE — Op Note (Signed)
[place] Patient Name: Kenneth Leach Procedure Date: 12/02/2015 8:42 AM MRN: [id number] Endoscopist: Ladene Artist , MD Age: 60 Referring MD:  Date of Birth: 07/17/1955 Gender: Male Account #: [id number] Procedure:                Colonoscopy Indications:              Screening in patient at increased risk: Family                            history of 1st-degree relative with colorectal                            cancer Medicines:                Monitored Anesthesia Care Procedure:                Pre-Anesthesia Assessment:                           - Prior to the procedure, a History and Physical                            was performed, and patient medications and                            allergies were reviewed. The patient's tolerance of                            previous anesthesia was also reviewed. The risks                            and benefits of the procedure and the sedation                            options and risks were discussed with the patient.                            All questions were answered, and informed consent                            was obtained. Prior Anticoagulants: The patient has                            taken no previous anticoagulant or antiplatelet                            agents. ASA Grade Assessment: II - A patient with                            mild systemic disease. After reviewing the risks                            and benefits, the patient was deemed in  satisfactory condition to undergo the procedure.                           After obtaining informed consent, the colonoscope                            was passed under direct vision. Throughout the                            procedure, the patient's blood pressure, pulse, and                            oxygen saturations were monitored continuously. The                            Model PCF-[id number]) scope was introduced                    through the anus and advanced to the the cecum,                            identified by appendiceal orifice and ileocecal                            valve. The ileocecal valve, appendiceal orifice,                            and rectum were photographed. The quality of the                            bowel preparation was excellent. The colonoscopy                            was performed without difficulty. The patient                            tolerated the procedure well. Scope In: 8:57:37 AM Scope Out: 9:11:11 AM Scope Withdrawal Time: 0 hours 11 minutes 57 seconds  Total Procedure Duration: 0 hours 13 minutes 34 seconds  Findings:                 The perianal and digital rectal examinations were                            normal.                           A 8 mm polyp was found in the ileocecal valve. The                            polyp was sessile. The polyp was removed with a                            cold snare. Resection and retrieval were complete.  Internal hemorrhoids were found during                            retroflexion. The hemorrhoids were small and Grade                            I (internal hemorrhoids that do not prolapse).                           A 4 mm polyp was found in the hepatic flexure. The                            polyp was sessile. The polyp was removed with a                            cold biopsy forceps. Resection and retrieval were                            complete.                           The exam was otherwise without abnormality on                            direct and retroflexion views. Complications:            No immediate complications. Estimated blood loss:                            None. Estimated Blood Loss:     Estimated blood loss: none. Impression:               - One 8 mm polyp at the ileocecal valve, removed                            with a cold snare. Resected and retrieved.                            - Internal hemorrhoids.                           - One 4 mm polyp at the hepatic flexure, removed                            with a cold biopsy forceps. Resected and retrieved.                           - The examination was otherwise normal on direct                            and retroflexion views. Recommendation:           - Repeat colonoscopy in 5 years for surveillance.                           - Patient  has a contact number available for                            emergencies. The signs and symptoms of potential                            delayed complications were discussed with the                            patient. Return to normal activities tomorrow.                            Written discharge instructions were provided to the                            patient.                           - Resume previous diet.                           - Continue present medications.                           - Await pathology results. Ladene Artist, MD 12/02/2015 9:15:40 AM This report has been signed electronically.

## 2015-12-02 NOTE — Progress Notes (Signed)
No problems noted in the recovery room. maw 

## 2015-12-02 NOTE — Progress Notes (Signed)
Called to room to assist during endoscopic procedure.  Patient ID and intended procedure confirmed with present staff. Received instructions for my participation in the procedure from the performing physician.  

## 2015-12-05 ENCOUNTER — Telehealth: Payer: Self-pay | Admitting: *Deleted

## 2015-12-05 NOTE — Telephone Encounter (Signed)
   Follow up Call-  Call back number 12/02/2015  Post procedure Call Back phone  # [phone]  Permission to leave phone message Yes  Some recent data might be hidden     Patient questions:  Do you have a fever, pain , or abdominal swelling? No. Pain Score  0 *  Have you tolerated food without any problems? Yes.    Have you been able to return to your normal activities? Yes.    Do you have any questions about your discharge instructions: Diet   No. Medications  No. Follow up visit  No.  Do you have questions or concerns about your Care? No.  Actions: * If pain score is 4 or above: No action needed, pain <4.

## 2015-12-05 NOTE — Anesthesia Preprocedure Evaluation (Addendum)
Anesthesia Evaluation  Patient identified by MRN, date of birth, ID band Patient awake    Reviewed: Allergy & Precautions, NPO status , Patient's Chart, lab work & pertinent test results  History of Anesthesia Complications Negative for: history of anesthetic complications  Airway Mallampati: II  TM Distance: >3 FB Neck ROM: Full    Dental  (+) Dental Advisory Given   Pulmonary neg pulmonary ROS,    breath sounds clear to auscultation       Cardiovascular negative cardio ROS   Rhythm:Regular Rate:Normal     Neuro/Psych negative neurological ROS     GI/Hepatic Neg liver ROS, GERD  Medicated and Controlled,  Endo/Other  Hypothyroidism   Renal/GU negative Renal ROS     Musculoskeletal   Abdominal   Peds  Hematology negative hematology ROS (+)   Anesthesia Other Findings   Reproductive/Obstetrics                            Anesthesia Physical Anesthesia Plan  ASA: II  Anesthesia Plan: General   Post-op Pain Management:    Induction: Intravenous  Airway Management Planned: Oral ETT  Additional Equipment:   Intra-op Plan:   Post-operative Plan: Extubation in OR  Informed Consent: I have reviewed the patients History and Physical, chart, labs and discussed the procedure including the risks, benefits and alternatives for the proposed anesthesia with the patient or authorized representative who has indicated his/her understanding and acceptance.   Dental advisory given  Plan Discussed with: CRNA and Surgeon  Anesthesia Plan Comments: (Plan routine monitors, GETA)        Anesthesia Quick Evaluation

## 2015-12-06 ENCOUNTER — Ambulatory Visit (HOSPITAL_COMMUNITY): Payer: BLUE CROSS/BLUE SHIELD | Admitting: Certified Registered Nurse Anesthetist

## 2015-12-06 ENCOUNTER — Ambulatory Visit (HOSPITAL_COMMUNITY)
Admission: RE | Admit: 2015-12-06 | Discharge: 2015-12-06 | Disposition: A | Discharge status: Home / Self Care | Payer: BLUE CROSS/BLUE SHIELD | Source: Ambulatory Visit | Attending: General Surgery | Admitting: General Surgery

## 2015-12-06 ENCOUNTER — Encounter (HOSPITAL_COMMUNITY)
Admission: RE | Disposition: A | Discharge status: Home / Self Care | Payer: Self-pay | Source: Ambulatory Visit | Attending: General Surgery

## 2015-12-06 ENCOUNTER — Encounter (HOSPITAL_COMMUNITY): Payer: Self-pay | Admitting: Certified Registered Nurse Anesthetist

## 2015-12-06 DIAGNOSIS — K4090 Unilateral inguinal hernia, without obstruction or gangrene, not specified as recurrent: Secondary | ICD-10-CM | POA: Diagnosis not present

## 2015-12-06 DIAGNOSIS — Z5331 Laparoscopic surgical procedure converted to open procedure: Secondary | ICD-10-CM | POA: Diagnosis not present

## 2015-12-06 DIAGNOSIS — K219 Gastro-esophageal reflux disease without esophagitis: Secondary | ICD-10-CM | POA: Insufficient documentation

## 2015-12-06 DIAGNOSIS — E785 Hyperlipidemia, unspecified: Secondary | ICD-10-CM | POA: Diagnosis not present

## 2015-12-06 DIAGNOSIS — E039 Hypothyroidism, unspecified: Secondary | ICD-10-CM | POA: Diagnosis not present

## 2015-12-06 HISTORY — PX: INGUINAL HERNIA REPAIR: SHX194

## 2015-12-06 HISTORY — PX: LAPAROSCOPIC ABDOMINAL EXPLORATION: SHX6249

## 2015-12-06 HISTORY — PX: INSERTION OF MESH: SHX5868

## 2015-12-06 SURGERY — REPAIR, HERNIA, INGUINAL, ADULT
Anesthesia: General | Site: Groin | Laterality: Right

## 2015-12-06 MED ORDER — EPHEDRINE SULFATE 50 MG/ML IJ SOLN
INTRAMUSCULAR | Status: DC | PRN
  Administered 2015-12-06 (×2): 5 mg via INTRAVENOUS

## 2015-12-06 MED ORDER — MEPERIDINE HCL 25 MG/ML IJ SOLN: 6.2500 mg | INTRAMUSCULAR | Status: DC | PRN

## 2015-12-06 MED ORDER — MIDAZOLAM HCL 2 MG/2ML IJ SOLN
INTRAMUSCULAR | Status: AC
  Filled 2015-12-06: qty 2

## 2015-12-06 MED ORDER — SODIUM CHLORIDE 0.9 % IR SOLN
Status: DC | PRN
  Administered 2015-12-06: 500 mL

## 2015-12-06 MED ORDER — LACTATED RINGERS IV SOLN
INTRAVENOUS | Status: DC | PRN
  Administered 2015-12-06 (×2): via INTRAVENOUS

## 2015-12-06 MED ORDER — PHENYLEPHRINE 40 MCG/ML (10ML) SYRINGE FOR IV PUSH (FOR BLOOD PRESSURE SUPPORT)
PREFILLED_SYRINGE | INTRAVENOUS | Status: AC
  Filled 2015-12-06: qty 10

## 2015-12-06 MED ORDER — DEXAMETHASONE SODIUM PHOSPHATE 10 MG/ML IJ SOLN
INTRAMUSCULAR | Status: DC | PRN
Start: 2015-12-06 — End: 2015-12-06
  Administered 2015-12-06: 10 mg via INTRAVENOUS

## 2015-12-06 MED ORDER — PROPOFOL 10 MG/ML IV BOLUS
INTRAVENOUS | Status: AC
  Filled 2015-12-06: qty 20

## 2015-12-06 MED ORDER — FENTANYL CITRATE (PF) 100 MCG/2ML IJ SOLN
INTRAMUSCULAR | Status: DC | PRN
  Administered 2015-12-06: 20 ug via INTRAVENOUS
  Administered 2015-12-06: 200 ug via INTRAVENOUS

## 2015-12-06 MED ORDER — DIPHENHYDRAMINE HCL 50 MG/ML IJ SOLN
INTRAMUSCULAR | Status: AC
  Filled 2015-12-06: qty 1

## 2015-12-06 MED ORDER — HYDROCODONE-ACETAMINOPHEN 5-325 MG PO TABS: 1.0000 | ORAL_TABLET | Freq: Four times a day (QID) | ORAL | 0 refills | Status: DC | PRN

## 2015-12-06 MED ORDER — DEXAMETHASONE SODIUM PHOSPHATE 10 MG/ML IJ SOLN
INTRAMUSCULAR | Status: AC
  Filled 2015-12-06: qty 1

## 2015-12-06 MED ORDER — SUGAMMADEX SODIUM 200 MG/2ML IV SOLN
INTRAVENOUS | Status: AC
  Filled 2015-12-06: qty 2

## 2015-12-06 MED ORDER — MIDAZOLAM HCL 5 MG/5ML IJ SOLN
INTRAMUSCULAR | Status: DC | PRN
  Administered 2015-12-06: 2 mg via INTRAVENOUS

## 2015-12-06 MED ORDER — LIDOCAINE HCL (CARDIAC) 20 MG/ML IV SOLN
INTRAVENOUS | Status: DC | PRN
  Administered 2015-12-06: 40 mg via INTRAVENOUS

## 2015-12-06 MED ORDER — BUPIVACAINE-EPINEPHRINE 0.25% -1:200000 IJ SOLN
INTRAMUSCULAR | Status: DC | PRN
  Administered 2015-12-06: 18 mL

## 2015-12-06 MED ORDER — HYDROMORPHONE HCL 1 MG/ML IJ SOLN
INTRAMUSCULAR | Status: AC
  Filled 2015-12-06: qty 1

## 2015-12-06 MED ORDER — MIDAZOLAM HCL 2 MG/2ML IJ SOLN: 0.5000 mg | Freq: Once | INTRAMUSCULAR | Status: DC | PRN

## 2015-12-06 MED ORDER — ONDANSETRON HCL 4 MG/2ML IJ SOLN
INTRAMUSCULAR | Status: DC | PRN
  Administered 2015-12-06: 4 mg via INTRAVENOUS

## 2015-12-06 MED ORDER — DIPHENHYDRAMINE HCL 50 MG/ML IJ SOLN
INTRAMUSCULAR | Status: DC | PRN
  Administered 2015-12-06: 12.5 mg via INTRAVENOUS

## 2015-12-06 MED ORDER — BUPIVACAINE-EPINEPHRINE (PF) 0.25% -1:200000 IJ SOLN
INTRAMUSCULAR | Status: AC
  Filled 2015-12-06: qty 30

## 2015-12-06 MED ORDER — FENTANYL CITRATE (PF) 100 MCG/2ML IJ SOLN
INTRAMUSCULAR | Status: AC
  Filled 2015-12-06: qty 4

## 2015-12-06 MED ORDER — SUGAMMADEX SODIUM 200 MG/2ML IV SOLN
INTRAVENOUS | Status: DC | PRN
  Administered 2015-12-06: 200 mg via INTRAVENOUS

## 2015-12-06 MED ORDER — CEFAZOLIN SODIUM-DEXTROSE 2-4 GM/100ML-% IV SOLN
2.0000 g | INTRAVENOUS | Status: AC
  Administered 2015-12-06: 2 g via INTRAVENOUS
  Filled 2015-12-06: qty 100

## 2015-12-06 MED ORDER — FENTANYL CITRATE (PF) 100 MCG/2ML IJ SOLN
INTRAMUSCULAR | Status: AC
  Filled 2015-12-06: qty 2

## 2015-12-06 MED ORDER — PROPOFOL 10 MG/ML IV BOLUS
INTRAVENOUS | Status: DC | PRN
  Administered 2015-12-06: 200 mg via INTRAVENOUS

## 2015-12-06 MED ORDER — PROMETHAZINE HCL 25 MG/ML IJ SOLN: 6.2500 mg | INTRAMUSCULAR | Status: DC | PRN

## 2015-12-06 MED ORDER — HYDROMORPHONE HCL 1 MG/ML IJ SOLN
0.2500 mg | INTRAMUSCULAR | Status: DC | PRN
  Administered 2015-12-06 (×2): 0.25 mg via INTRAVENOUS
  Administered 2015-12-06: 0.5 mg via INTRAVENOUS

## 2015-12-06 MED ORDER — 0.9 % SODIUM CHLORIDE (POUR BTL) OPTIME
TOPICAL | Status: DC | PRN
  Administered 2015-12-06: 1000 mL

## 2015-12-06 MED ORDER — ONDANSETRON HCL 4 MG/2ML IJ SOLN
INTRAMUSCULAR | Status: AC
  Filled 2015-12-06: qty 2

## 2015-12-06 MED ORDER — ROCURONIUM BROMIDE 100 MG/10ML IV SOLN
INTRAVENOUS | Status: DC | PRN
  Administered 2015-12-06: 10 mg via INTRAVENOUS
  Administered 2015-12-06: 15 mg via INTRAVENOUS
  Administered 2015-12-06: 50 mg via INTRAVENOUS

## 2015-12-06 SURGICAL SUPPLY — 50 items
ADH SKN CLS APL DERMABOND .7 (GAUZE/BANDAGES/DRESSINGS) ×3
APPLIER CLIP 5 13 M/L LIGAMAX5 (MISCELLANEOUS)
APR CLP MED LRG 5 ANG JAW (MISCELLANEOUS)
BAG DECANTER FOR FLEXI CONT (MISCELLANEOUS) ×4 IMPLANT
CHLORAPREP W/TINT 26ML (MISCELLANEOUS) ×4 IMPLANT
CLIP APPLIE 5 13 M/L LIGAMAX5 (MISCELLANEOUS) IMPLANT
COVER SURGICAL LIGHT HANDLE (MISCELLANEOUS) ×4 IMPLANT
DECANTER SPIKE VIAL GLASS SM (MISCELLANEOUS) ×4 IMPLANT
DERMABOND ADVANCED (GAUZE/BANDAGES/DRESSINGS) ×1
DERMABOND ADVANCED .7 DNX12 (GAUZE/BANDAGES/DRESSINGS) ×3 IMPLANT
DEVICE SECURE STRAP 25 ABSORB (INSTRUMENTS) ×4 IMPLANT
DISSECT BALLN SPACEMKR + OVL (BALLOONS) ×4
DISSECTOR BALLN SPACEMKR + OVL (BALLOONS) ×3 IMPLANT
DISSECTOR BLUNT TIP ENDO 5MM (MISCELLANEOUS) IMPLANT
DRAIN PENROSE 1/4X12 LTX STRL (WOUND CARE) ×2 IMPLANT
DRSG TEGADERM 2-3/8X2-3/4 SM (GAUZE/BANDAGES/DRESSINGS) ×12 IMPLANT
ELECT CAUTERY BLADE 6.4 (BLADE) ×2 IMPLANT
ELECT REM PT RETURN 9FT ADLT (ELECTROSURGICAL) ×4
ELECTRODE REM PT RTRN 9FT ADLT (ELECTROSURGICAL) ×3 IMPLANT
GAUZE SPONGE 4X4 12PLY STRL (GAUZE/BANDAGES/DRESSINGS) ×2 IMPLANT
GLOVE BIOGEL PI IND STRL 8 (GLOVE) ×3 IMPLANT
GLOVE BIOGEL PI INDICATOR 8 (GLOVE) ×1
GLOVE ECLIPSE 7.5 STRL STRAW (GLOVE) ×4 IMPLANT
GOWN STRL REUS W/ TWL LRG LVL3 (GOWN DISPOSABLE) ×9 IMPLANT
GOWN STRL REUS W/TWL LRG LVL3 (GOWN DISPOSABLE) ×12
KIT BASIN OR (CUSTOM PROCEDURE TRAY) ×4 IMPLANT
KIT ROOM TURNOVER OR (KITS) ×4 IMPLANT
MESH BARD SOFT 3X6IN (Mesh General) IMPLANT
MESH HERNIA 3X6 (Mesh General) ×2 IMPLANT
NS IRRIG 1000ML POUR BTL (IV SOLUTION) ×4 IMPLANT
PAD ARMBOARD 7.5X6 YLW CONV (MISCELLANEOUS) ×4 IMPLANT
PENCIL BUTTON HOLSTER BLD 10FT (ELECTRODE) ×2 IMPLANT
SET IRRIG TUBING LAPAROSCOPIC (IRRIGATION / IRRIGATOR) IMPLANT
SET TROCAR LAP APPLE-HUNT 5MM (ENDOMECHANICALS) ×4 IMPLANT
SPONGE INTESTINAL PEANUT (DISPOSABLE) ×2 IMPLANT
STRIP CLOSURE SKIN 1/2X4 (GAUZE/BANDAGES/DRESSINGS) ×4 IMPLANT
SUT ETHIBOND 0 MO6 C/R (SUTURE) ×2 IMPLANT
SUT MNCRL AB 4-0 PS2 18 (SUTURE) ×4 IMPLANT
SUT PROLENE 0 CT 1 30 (SUTURE) ×4 IMPLANT
SUT VIC AB 3-0 SH 27 (SUTURE) ×12
SUT VIC AB 3-0 SH 27X BRD (SUTURE) ×2 IMPLANT
SUT VIC AB 3-0 SH 27XBRD (SUTURE) ×1 IMPLANT
SYR BULB 3OZ (MISCELLANEOUS) ×2 IMPLANT
TOWEL OR 17X24 6PK STRL BLUE (TOWEL DISPOSABLE) ×4 IMPLANT
TOWEL OR 17X26 10 PK STRL BLUE (TOWEL DISPOSABLE) ×4 IMPLANT
TRAY FOLEY CATH 14FRSI W/METER (CATHETERS) IMPLANT
TRAY LAPAROSCOPIC MC (CUSTOM PROCEDURE TRAY) ×4 IMPLANT
TUBE CONNECTING 12X1/4 (SUCTIONS) ×2 IMPLANT
TUBING INSUFFLATION (TUBING) ×4 IMPLANT
YANKAUER SUCT BULB TIP NO VENT (SUCTIONS) ×2 IMPLANT

## 2015-12-06 NOTE — Transfer of Care (Signed)
Immediate Anesthesia Transfer of Care Note  Patient: Kenneth Leach  Procedure(s) Performed: Procedure(s): INSERTION OF MESH (Right) HERNIA REPAIR INGUINAL ADULT (Right) LAPAROSCOPIC ABDOMINAL EXPLORATION (N/A)  Patient Location: PACU  Anesthesia Type:General  Level of Consciousness: awake  Airway & Oxygen Therapy: Patient Spontanous Breathing and Patient connected to nasal cannula oxygen  Post-op Assessment: Report given to RN, Post -op Vital signs reviewed and stable and Patient moving all extremities X 4  Post vital signs: Reviewed and stable  Last Vitals:  Vitals:   12/06/15 0633 12/06/15 0910  BP: 125/74 (P) 126/73  Pulse: (!) 51 (P) 61  Resp: 16 (P) 18  Temp: 36.6 C (P) 36.4 C    Last Pain:  Vitals:   12/06/15 [id number]  TempSrc: Oral         Complications: No apparent anesthesia complications

## 2015-12-06 NOTE — Discharge Instructions (Addendum)
Open Hernia Repair, Care After Refer to this sheet in the next few weeks. These instructions provide you with information on caring for yourself after your procedure. Your health care provider may also give you more specific instructions. Your treatment has been planned according to current medical practices, but problems sometimes occur. Call your health care provider if you have any problems or questions after your procedure. WHAT TO EXPECT AFTER THE PROCEDURE After your procedure, it is typical to have the following:  Pain in your abdomen, especially along your incision. You will be given pain medicines to control the pain.  Constipation. You may be given a stool softener to help prevent this.  You will also note some discoloration of the genital area.  This is normal as long as it is not tense or extremely painful. HOME CARE INSTRUCTIONS  Only take over-the-counter or prescription medicines as directed by your health care provider.  Keep the incision area dry and clean. You may wash the incision area gently with soap and water 48 hours after surgery. Gently blot or dab the incision area dry. Do not take baths, use swimming pools, or use hot tubs for 10 days or until your health care provider approves.  Change bandages (dressings) as directed by your health care provider.  Continue your normal diet as directed by your health care provider. Eat plenty of fruits and vegetables to help prevent constipation.  Drink enough fluids to keep your urine clear or pale yellow. This also helps prevent constipation.  Do not drive until your health care provider says it is okay.  Do not lift anything heavier than 10 lb (4.5 kg) or play contact sports for 4 weeks or until your health care provider approves.  Follow up with your health care provider as directed. Ask your health care provider when to make an appointment to have your stitches (sutures) or staples removed. SEEK MEDICAL CARE IF:  You have  increased bleeding coming from the incision site.  You have blood in your stool.  You have increasing pain in the incision area.  You see redness or swelling in the incision area.  You have fluid (pus) coming from the incision.  You have a fever.  You notice a bad smell coming from the incision area or dressing. SEEK IMMEDIATE MEDICAL CARE IF:  You develop a rash.  You have chest pain or shortness of breath.  You feel lightheaded or feel faint.   This information is not intended to replace advice given to you by your health care provider. Make sure you discuss any questions you have with your health care provider.   Document Released: 09/15/2004 Document Revised: 03/19/2014 Document Reviewed: 10/08/2012 Elsevier Interactive Patient Education [employer].

## 2015-12-06 NOTE — Anesthesia Postprocedure Evaluation (Signed)
Anesthesia Post Note  Patient: Kenneth Leach  Procedure(s) Performed: Procedure(s) (LRB): INSERTION OF MESH (Right) HERNIA REPAIR INGUINAL ADULT (Right) LAPAROSCOPIC ABDOMINAL EXPLORATION (N/A)  Patient location during evaluation: PACU Anesthesia Type: General Level of consciousness: awake and alert, oriented and patient cooperative Pain management: pain level controlled Vital Signs Assessment: post-procedure vital signs reviewed and stable Respiratory status: spontaneous breathing, nonlabored ventilation and respiratory function stable Cardiovascular status: blood pressure returned to baseline and stable Postop Assessment: no signs of nausea or vomiting Anesthetic complications: no    Last Vitals:  Vitals:   12/06/15 1145 12/06/15 1200  BP: 107/79 121/87  Pulse: 76 65  Resp: 16 14  Temp:      Last Pain:  Vitals:   12/06/15 1130  TempSrc:   PainSc: Asleep                 Heydi Swango,E. Dosha Broshears

## 2015-12-06 NOTE — Progress Notes (Signed)
Patient attempted to get up and go to the bathroom, upon leaving the bathroom patient became pale and diaphoretic felt like he was going to pass out. Patient put into a recliner states after leaning back feels better. Patients vitals taken upon return to bay from bathroom.

## 2015-12-06 NOTE — Op Note (Signed)
OPERATIVE REPORT  DATE OF OPERATION: 12/06/2015  PATIENT:  Kenneth Leach  60 y.o. male  PRE-OPERATIVE DIAGNOSIS:  Symptomatic right inguinal hernia  POST-OPERATIVE DIAGNOSIS:  Symptomatic right inguinal hernia  INDICATIONS FOR OPERATION:  Symptomatic rihgt inguinal hernia  FINDINGS:  Dissecting balloon entered the peritoneal cavity, laparoscopic procedure aborted.  PROCEDURE:  Procedure(s): INSERTION OF MESH HERNIA REPAIR INGUINAL ADULT LAPAROSCOPIC ABDOMINAL EXPLORATION  SURGEON:  Surgeon(s): Judeth Horn, MD  ASSISTANT: None  ANESTHESIA:   general  COMPLICATIONS:  None  EBL: <20 ml  BLOOD ADMINISTERED: none  DRAINS: none   SPECIMEN:  No Specimen  COUNTS CORRECT:  YES  PROCEDURE DETAILS: The patient was taken to the operating room and placed on the table in supine position. After an adequate general endotracheal anesthetic was administered he was prepped and draped in the usual sterile manner exposing his abdomen particularly on the right side involving the inguinal area.  A proper timeout was performed identifying the patient and procedure to be performed. A right paraumbilical transverse incision was made in the mid rectus area using a #15 blade. We incised the anterior rectus sheath with the 15 blade then split the muscle down to the procedure fascia. Was at this level that the dissecting balloon was passed and was thought to be preperitoneal space down towards the pubic tubercle however it would never felt disoriented when into the plane very easily. We subsequently removed the inner trocar then insufflated the balloon using the bulb syringe attached with the device. This was with the laparoscope and attached camera and light source in place. It appeared to be dissecting out the preperitoneal space well however upon insufflating the area with carbon dioxide gas and inserting the laparoscope it was in the intraperitoneal position. The decision was made to change from a  laparoscopic to an open repair.  A right transverse curvilinear incision approximately 7 cm long was made using #15 blade and taken down to subcutaneous tissue. We dissected down to the external oblique fascia which was subsequently split along its fibers through the superficial ring. The spermatic cord was dissected out and mobilize with a Penrose drain. It was noted that the patient have mostly a large direct sac which was imbricated on itself with 0 Ethibond sutures and subsequently repaired with an oval piece of polypropylene mesh. There was a small indirect sac which was suture-ligated at its base using an 0 Ethibond suture. Or Ethibond suture was also used to imbricate the direct sac.  The polypropylene mesh was soaked in and about solution and subsequently inserted and secured in place using a running all Prolene suture. Once this was done anabiotic solution was used to irrigate the wound and the external oblique fascia was closed using running 3-0 Vicryl suture. We then closed Scarpa's fascia using interrupted 3-0 Vicryl suture. 0.25% Marcaine with epinephrine was injected into the subcutaneous tissue around the incision. The skin was closed using a running subcuticular stitch of 4-0 Monocryl. Steri-Strips, done Tegaderm, and Dermabond used to complete the dressings.  PATIENT DISPOSITION:  PACU - hemodynamically stable.   Betzy Barbier 9/26/20178:54 AM

## 2015-12-06 NOTE — H&P (Signed)
Kenneth Leach is an 60 y.o. male.   Chief Complaint: Right inguinal hernia, symptomatic HPI: This has been present for a while, recently more symptomatic, but never incarcerated.  Past Medical History:  Diagnosis Date  . Allergy   . GERD (gastroesophageal reflux disease)   . Hyperlipidemia   . Hypothyroidism   . Inguinal hernia    right  . Thyroid disease     Past Surgical History:  Procedure Laterality Date  . COLONOSCOPY     had 2 normal colons per pt    Family History  Problem Relation Age of Onset  . Colon cancer Father   . Colon cancer Paternal Uncle   . Colon polyps Neg Hx   . Rectal cancer Neg Hx   . Stomach cancer Neg Hx    Social History:  reports that he has never smoked. He has never used smokeless tobacco. He reports that he drinks alcohol. He reports that he does not use drugs.  Allergies: No Known Allergies  Facility-Administered Medications Prior to Admission  Medication Dose Route Frequency Provider Last Rate Last Dose  . 0.9 %  sodium chloride infusion  500 mL Intravenous Continuous Ladene Artist, MD       Medications Prior to Admission  Medication Sig Dispense Refill  . omeprazole (PRILOSEC) 20 MG capsule Take 20 mg by mouth daily.    . simvastatin (ZOCOR) 40 MG tablet Take 40 mg by mouth daily.    Marland Kitchen SYNTHROID 100 MCG tablet Take 100 mcg by mouth daily.    Marland Kitchen ibuprofen (ADVIL,MOTRIN) 200 MG tablet Take 200-400 mg by mouth every 6 (six) hours as needed for moderate pain.      No results found for this or any previous visit (from the past 48 hour(s)). No results found.  Review of Systems  All other systems reviewed and are negative.   Blood pressure 125/74, pulse (!) 51, temperature 97.8 F (36.6 C), temperature source Oral, resp. rate 16, weight 95.7 kg (211 lb), SpO2 96 %. Physical Exam  Constitutional: He is oriented to person, place, and time. He appears well-developed and well-nourished.  HENT:  Head: Normocephalic and atraumatic.   Right Ear: External ear normal.  Left Ear: External ear normal.  Eyes: Conjunctivae and EOM are normal. Pupils are equal, round, and reactive to light.  Neck: Normal range of motion. Neck supple.  Cardiovascular: Normal rate, regular rhythm and normal heart sounds.   Respiratory: Effort normal and breath sounds normal.  GI: Soft. Normal appearance and bowel sounds are normal. There is no tenderness. A hernia is present. Hernia confirmed positive in the right inguinal area.  Genitourinary: Penis normal.  Neurological: He is alert and oriented to person, place, and time. He has normal reflexes.  Skin: Skin is warm and dry.  Psychiatric: He has a normal mood and affect. His behavior is normal. Judgment and thought content normal.     Assessment/Plan Reducible RIH, for laparoscopic repair.  The risks and benefits have been explained to the patient, including the possibility of open repair, and he wishes to proceed.  Judeth Horn, MD 12/06/2015, 6:44 AM

## 2015-12-06 NOTE — Anesthesia Procedure Notes (Signed)

## 2015-12-07 ENCOUNTER — Encounter (HOSPITAL_COMMUNITY): Payer: Self-pay | Admitting: General Surgery

## 2015-12-12 ENCOUNTER — Encounter: Payer: Self-pay | Admitting: Gastroenterology

## 2016-04-03 DIAGNOSIS — Z13220 Encounter for screening for lipoid disorders: Secondary | ICD-10-CM | POA: Diagnosis not present

## 2016-04-03 DIAGNOSIS — Z713 Dietary counseling and surveillance: Secondary | ICD-10-CM | POA: Diagnosis not present

## 2016-04-03 DIAGNOSIS — Z131 Encounter for screening for diabetes mellitus: Secondary | ICD-10-CM | POA: Diagnosis not present

## 2016-04-03 DIAGNOSIS — Z136 Encounter for screening for cardiovascular disorders: Secondary | ICD-10-CM | POA: Diagnosis not present

## 2016-04-03 DIAGNOSIS — Z23 Encounter for immunization: Secondary | ICD-10-CM | POA: Diagnosis not present

## 2016-05-03 DIAGNOSIS — R05 Cough: Secondary | ICD-10-CM | POA: Diagnosis not present

## 2016-05-03 DIAGNOSIS — J019 Acute sinusitis, unspecified: Secondary | ICD-10-CM | POA: Diagnosis not present

## 2016-05-03 DIAGNOSIS — E039 Hypothyroidism, unspecified: Secondary | ICD-10-CM | POA: Insufficient documentation

## 2016-10-09 DIAGNOSIS — E039 Hypothyroidism, unspecified: Secondary | ICD-10-CM | POA: Diagnosis not present

## 2016-10-09 DIAGNOSIS — E7800 Pure hypercholesterolemia, unspecified: Secondary | ICD-10-CM | POA: Diagnosis not present

## 2016-10-09 DIAGNOSIS — K219 Gastro-esophageal reflux disease without esophagitis: Secondary | ICD-10-CM | POA: Diagnosis not present

## 2016-10-09 DIAGNOSIS — N529 Male erectile dysfunction, unspecified: Secondary | ICD-10-CM | POA: Diagnosis not present

## 2017-04-17 DIAGNOSIS — E7800 Pure hypercholesterolemia, unspecified: Secondary | ICD-10-CM | POA: Diagnosis not present

## 2017-04-17 DIAGNOSIS — N529 Male erectile dysfunction, unspecified: Secondary | ICD-10-CM | POA: Diagnosis not present

## 2017-04-17 DIAGNOSIS — K219 Gastro-esophageal reflux disease without esophagitis: Secondary | ICD-10-CM | POA: Diagnosis not present

## 2017-04-17 DIAGNOSIS — E039 Hypothyroidism, unspecified: Secondary | ICD-10-CM | POA: Diagnosis not present

## 2017-04-17 DIAGNOSIS — Z125 Encounter for screening for malignant neoplasm of prostate: Secondary | ICD-10-CM | POA: Diagnosis not present

## 2017-10-08 DIAGNOSIS — K219 Gastro-esophageal reflux disease without esophagitis: Secondary | ICD-10-CM | POA: Diagnosis not present

## 2017-10-08 DIAGNOSIS — E7800 Pure hypercholesterolemia, unspecified: Secondary | ICD-10-CM | POA: Diagnosis not present

## 2017-10-08 DIAGNOSIS — N529 Male erectile dysfunction, unspecified: Secondary | ICD-10-CM | POA: Diagnosis not present

## 2017-10-08 DIAGNOSIS — E039 Hypothyroidism, unspecified: Secondary | ICD-10-CM | POA: Diagnosis not present

## 2017-12-30 DIAGNOSIS — M79604 Pain in right leg: Secondary | ICD-10-CM | POA: Diagnosis not present

## 2017-12-30 DIAGNOSIS — R635 Abnormal weight gain: Secondary | ICD-10-CM | POA: Diagnosis not present

## 2018-01-21 IMAGING — US US THYROID
1 series · 14 of 25 positions shown · non-contrast
Comparison: 03/19/2013

CLINICAL DATA: Followup left thyroid nodule

EXAM:
THYROID ULTRASOUND
TECHNIQUE: Ultrasound examination of the thyroid gland and adjacent soft
tissues was performed.

[Series 1: us thyroid · 0.07mm/px · 14 of 36 slices shown]
[im 1/36]
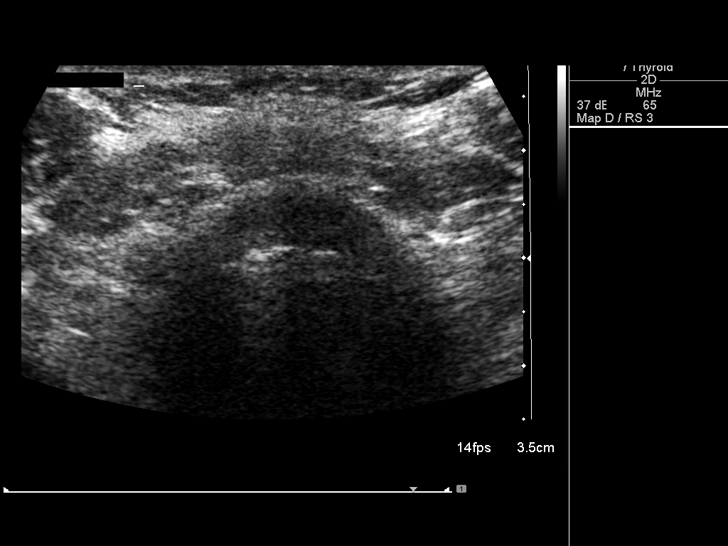
[im 3/36]
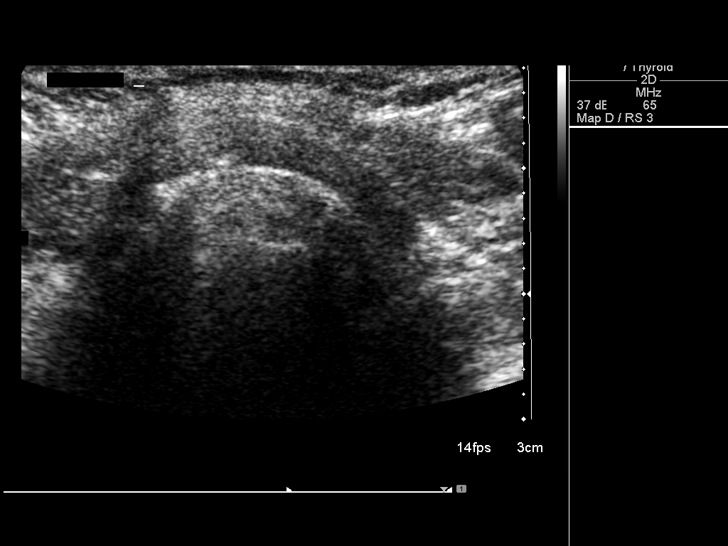
[im 6/36]
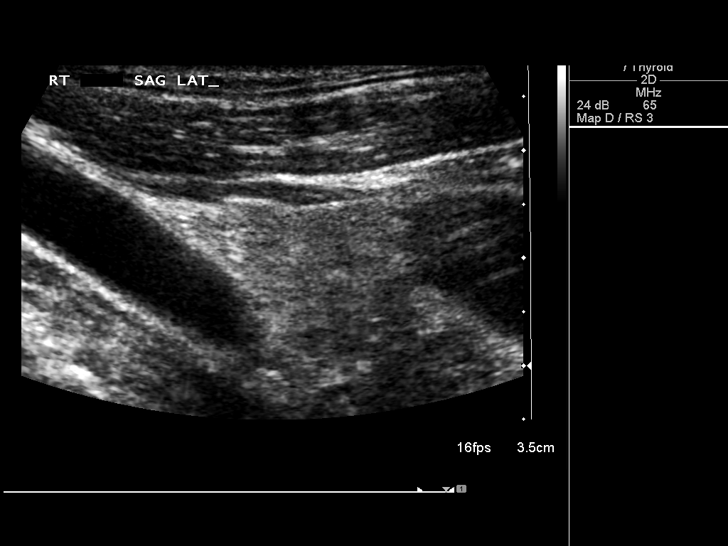
[im 9/36]
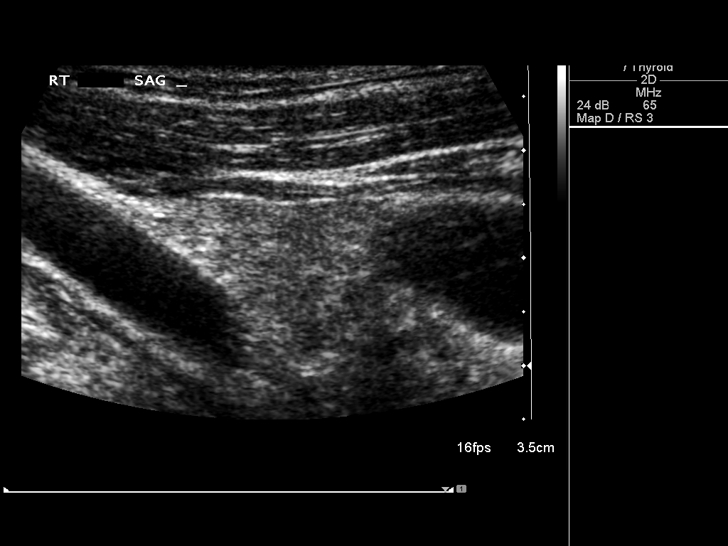
[im 12/36]
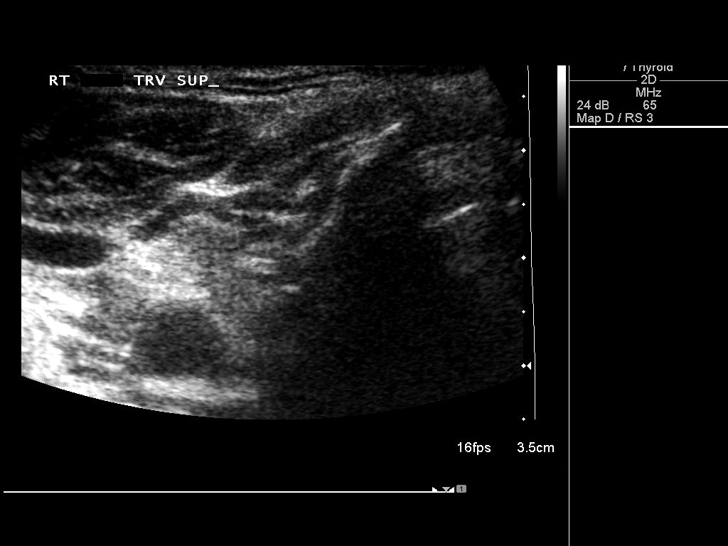
[im 14/36]
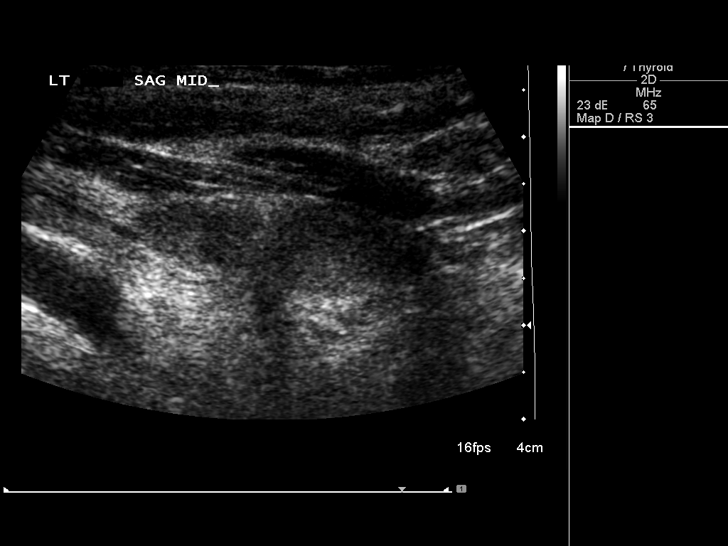
[im 17/36]
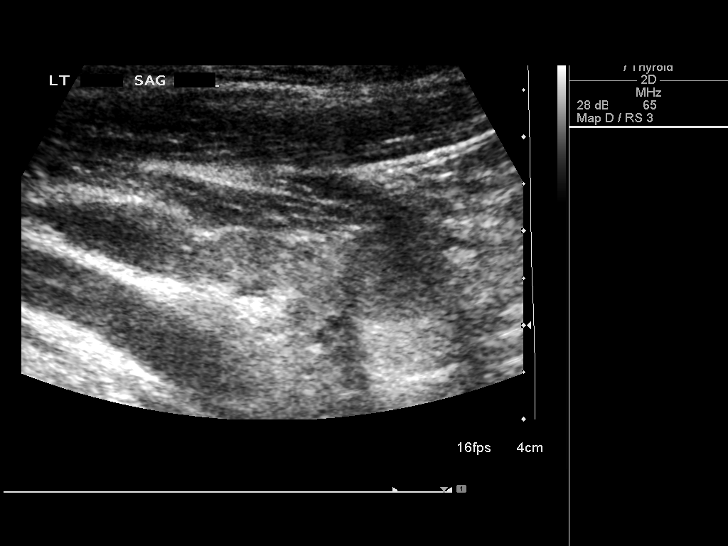
[im 19/36]
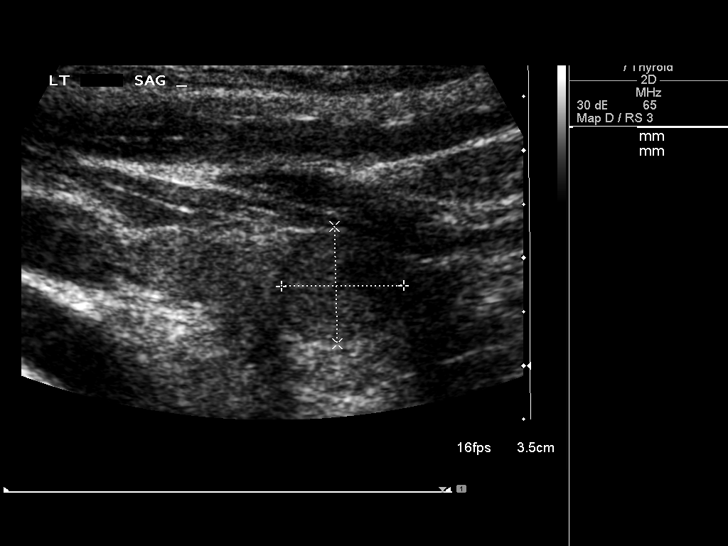
[im 22/36]
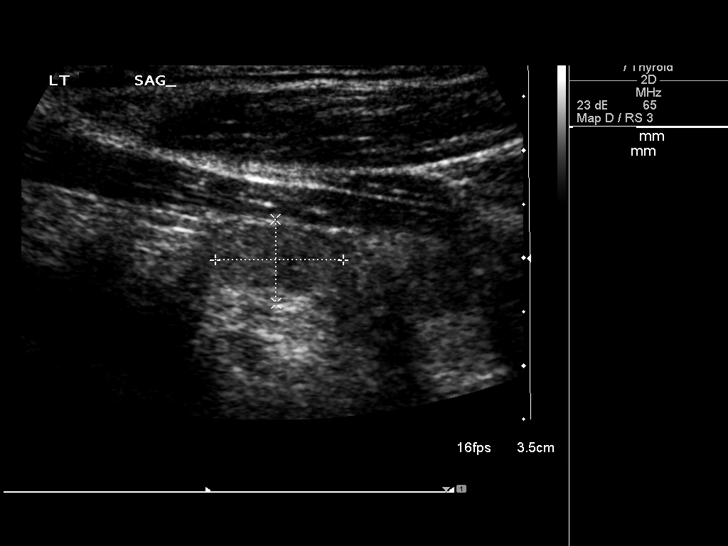
[im 24/36]
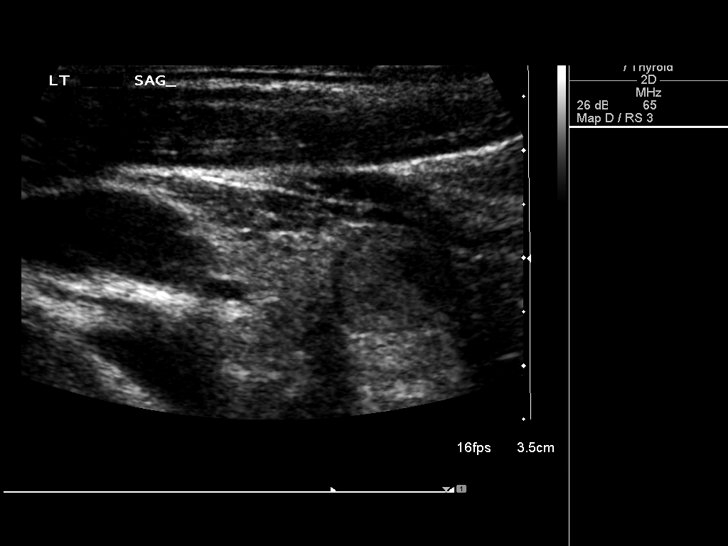
[im 27/36]
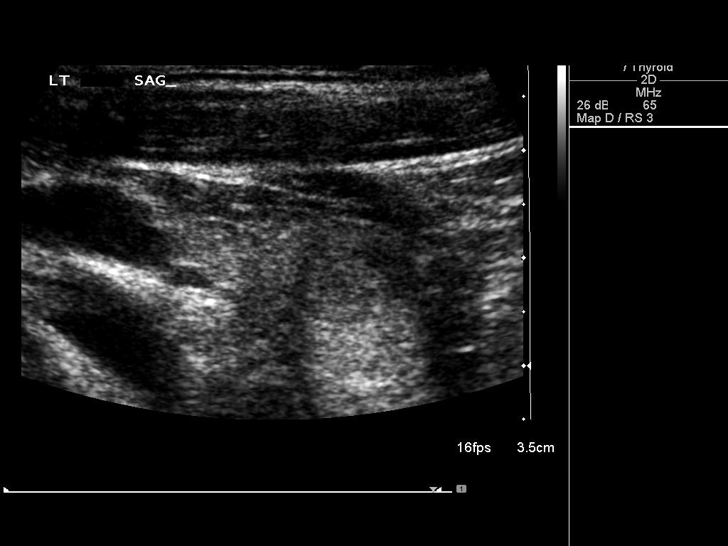
[im 30/36]
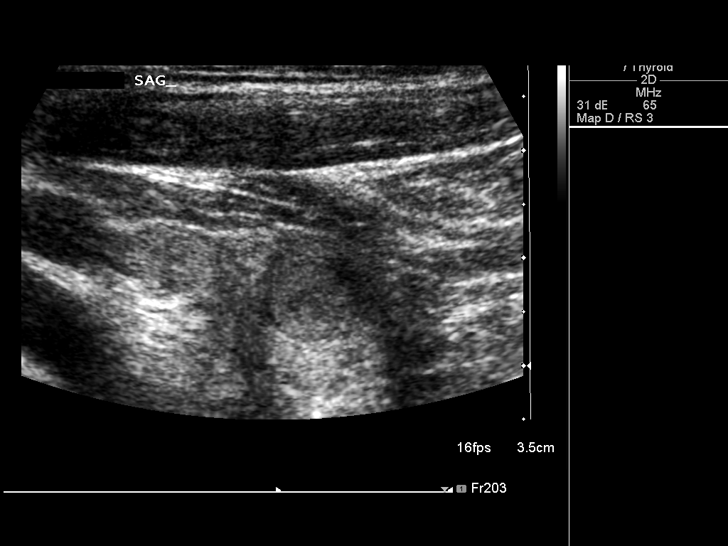
[im 33/36]
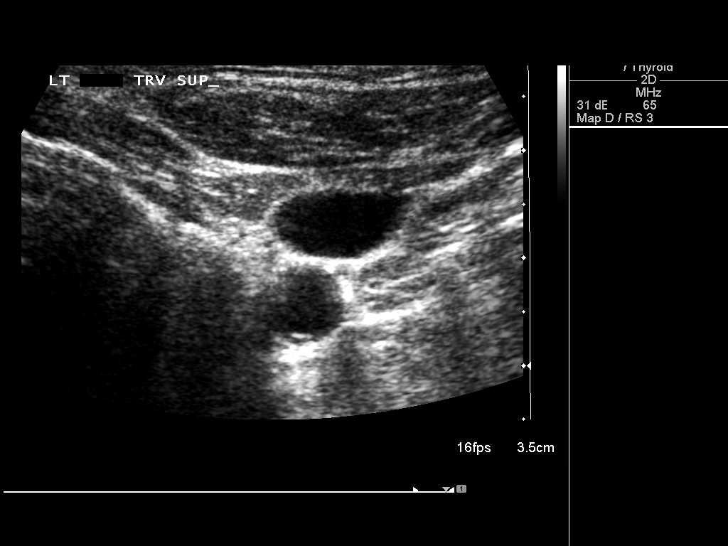
[im 36/36]
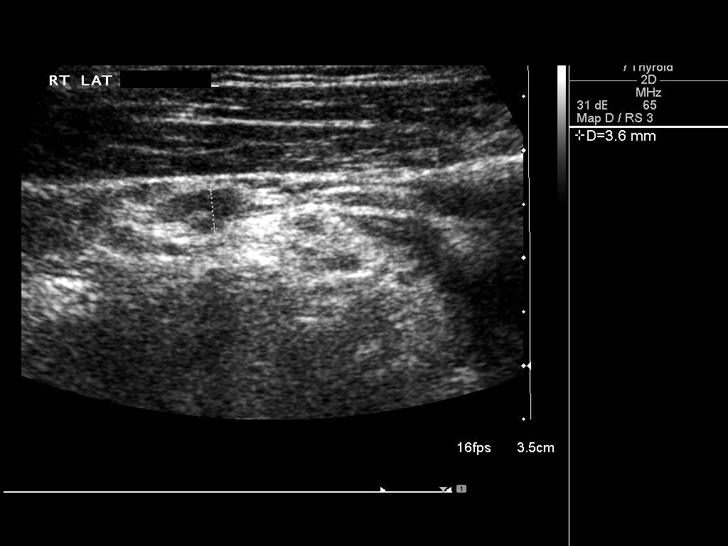

[14 of 25 positions shown; findings below may reference images not displayed]

FINDINGS: Right thyroid lobe

Measurements: 2.5 x 1.5 x 1.7 cm..  No nodules visualized.

Left thyroid lobe

Measurements: 3.3 x 1.2 x 1.3 cm.. There 2 relatively hypoechoic
thyroid nodules on the left. The more superior nodule measures 1.2 x
0.8 x 0.6 cm and is stable from the previous exam. A more inferior
1.1 x 1.1 x 1.0 cm nodule is noted. It is also stable from the prior
exam.

Isthmus

Thickness: 0.2 cm..  No nodules visualized.

Lymphadenopathy

None visualized.
IMPRESSION: Left thyroid nodules stable from the prior exam. Findings do not
meet current SRU consensus criteria for biopsy. Follow-up by
clinical exam is recommended. If patient has known risk factors for
thyroid carcinoma, consider follow-up ultrasound in 12 months. If
patient is clinically hyperthyroid, consider nuclear medicine
thyroid uptake and scan.Reference: Management of Thyroid Nodules
Detected at US: Society of Radiologists in Ultrasound Consensus

## 2018-08-22 DIAGNOSIS — Z20828 Contact with and (suspected) exposure to other viral communicable diseases: Secondary | ICD-10-CM | POA: Diagnosis not present

## 2018-10-15 DIAGNOSIS — E039 Hypothyroidism, unspecified: Secondary | ICD-10-CM | POA: Diagnosis not present

## 2018-10-15 DIAGNOSIS — E041 Nontoxic single thyroid nodule: Secondary | ICD-10-CM | POA: Diagnosis not present

## 2018-10-15 DIAGNOSIS — K219 Gastro-esophageal reflux disease without esophagitis: Secondary | ICD-10-CM | POA: Diagnosis not present

## 2018-10-15 DIAGNOSIS — E7800 Pure hypercholesterolemia, unspecified: Secondary | ICD-10-CM | POA: Diagnosis not present

## 2018-10-16 ENCOUNTER — Other Ambulatory Visit: Payer: Self-pay | Admitting: Family Medicine

## 2018-10-16 DIAGNOSIS — E041 Nontoxic single thyroid nodule: Secondary | ICD-10-CM

## 2018-10-23 ENCOUNTER — Ambulatory Visit
Admission: RE | Admit: 2018-10-23 | Discharge: 2018-10-23 | Disposition: A | Discharge status: Home / Self Care | Payer: BC Managed Care – PPO | Source: Ambulatory Visit | Attending: Family Medicine | Admitting: Family Medicine

## 2018-10-23 DIAGNOSIS — E041 Nontoxic single thyroid nodule: Secondary | ICD-10-CM | POA: Diagnosis not present

## 2018-10-24 DIAGNOSIS — E041 Nontoxic single thyroid nodule: Secondary | ICD-10-CM | POA: Diagnosis not present

## 2018-10-24 DIAGNOSIS — E7800 Pure hypercholesterolemia, unspecified: Secondary | ICD-10-CM | POA: Diagnosis not present

## 2018-10-24 DIAGNOSIS — Z125 Encounter for screening for malignant neoplasm of prostate: Secondary | ICD-10-CM | POA: Diagnosis not present

## 2018-12-09 ENCOUNTER — Ambulatory Visit (INDEPENDENT_AMBULATORY_CARE_PROVIDER_SITE_OTHER): Payer: BC Managed Care – PPO | Admitting: Podiatry

## 2018-12-09 ENCOUNTER — Other Ambulatory Visit: Payer: Self-pay

## 2018-12-09 ENCOUNTER — Encounter: Payer: Self-pay | Admitting: Podiatry

## 2018-12-09 VITALS — BP 111/81 | HR 101 | Resp 16

## 2018-12-09 DIAGNOSIS — L601 Onycholysis: Secondary | ICD-10-CM | POA: Diagnosis not present

## 2018-12-09 DIAGNOSIS — L603 Nail dystrophy: Secondary | ICD-10-CM

## 2018-12-09 DIAGNOSIS — B351 Tinea unguium: Secondary | ICD-10-CM | POA: Diagnosis not present

## 2018-12-10 ENCOUNTER — Encounter: Payer: Self-pay | Admitting: Podiatry

## 2018-12-10 NOTE — Progress Notes (Signed)
  Subjective:  Patient ID: Kenneth Leach, male    DOB: 09-24-1955,  MRN: [id number] HPI Chief Complaint  Patient presents with  . Nail Problem    Toenails - 1st, 2nd, 5th nails right - thick and discolored x years, tried OTC meds-no help  . New Patient (Initial Visit)    63 y.o. male presents with the above complaint.   ROS: Denies fever chills nausea vomiting muscle aches pains calf pain back pain chest pain shortness of breath.  Past Medical History:  Diagnosis Date  . Allergy   . GERD (gastroesophageal reflux disease)   . Hyperlipidemia   . Hypothyroidism   . Inguinal hernia    right  . Thyroid disease    Past Surgical History:  Procedure Laterality Date  . COLONOSCOPY     had 2 normal colons per pt  . INGUINAL HERNIA REPAIR Right 12/06/2015   Procedure: HERNIA REPAIR INGUINAL ADULT;  Surgeon: Judeth Horn, MD;  Location: [facility];  Service: General;  Laterality: Right;  . INSERTION OF MESH Right 12/06/2015   Procedure: INSERTION OF MESH;  Surgeon: Judeth Horn, MD;  Location: [facility];  Service: General;  Laterality: Right;  . LAPAROSCOPIC ABDOMINAL EXPLORATION N/A 12/06/2015   Procedure: LAPAROSCOPIC ABDOMINAL EXPLORATION;  Surgeon: Judeth Horn, MD;  Location: [facility] [facility];  Service: General;  Laterality: N/A;    Current Outpatient Medications:  .  omeprazole (PRILOSEC) 20 MG capsule, Take 20 mg by mouth daily., Disp: , Rfl:  .  simvastatin (ZOCOR) 40 MG tablet, Take 40 mg by mouth daily., Disp: , Rfl:  .  SYNTHROID 100 MCG tablet, Take 100 mcg by mouth daily., Disp: , Rfl:   No Known Allergies Review of Systems Objective:   Vitals:   12/09/18 1431  BP: 111/81  Pulse: (!) 101  Resp: 16    General: Well developed, nourished, in no acute distress, alert and oriented x3   Dermatological: Skin is warm, dry and supple bilateral. Nails x 10 are well maintained; remaining integument appears unremarkable at this time. There are no open sores, no preulcerative lesions, no rash or  signs of infection present.  Hallux nail right third nail right fifth nail right demonstrate thick and yellow dystrophic possibly mycotic nails with dry xerotic skin right only plantar aspect of the left foot appears to be normal and nails plates appear to be normal left.  Vascular: Dorsalis Pedis artery and Posterior Tibial artery pedal pulses are 2/4 bilateral with immedate capillary fill time. Pedal hair growth present. No varicosities and no lower extremity edema present bilateral.   Neruologic: Grossly intact via light touch bilateral. Vibratory intact via tuning fork bilateral. Protective threshold with Semmes Wienstein monofilament intact to all pedal sites bilateral. Patellar and Achilles deep tendon reflexes 2+ bilateral. No Babinski or clonus noted bilateral.   Musculoskeletal: No gross boney pedal deformities bilateral. No pain, crepitus, or limitation noted with foot and ankle range of motion bilateral. Muscular strength 5/5 in all groups tested bilateral.  Gait: Unassisted, Nonantalgic.    Radiographs:  No radiographs taken today  Assessment & Plan:   Assessment: Nail dystrophy possible tinea pedis right.   Plan: Samples of the skin and nail were taken today to be sent for pathologic evaluation I will follow-up with him in 1 month     Max T. [place], [place]

## 2018-12-26 DIAGNOSIS — Z23 Encounter for immunization: Secondary | ICD-10-CM | POA: Diagnosis not present

## 2019-01-06 ENCOUNTER — Ambulatory Visit (INDEPENDENT_AMBULATORY_CARE_PROVIDER_SITE_OTHER): Payer: BC Managed Care – PPO | Admitting: Podiatry

## 2019-01-06 ENCOUNTER — Other Ambulatory Visit: Payer: Self-pay

## 2019-01-06 DIAGNOSIS — Z79899 Other long term (current) drug therapy: Secondary | ICD-10-CM

## 2019-01-06 DIAGNOSIS — L603 Nail dystrophy: Secondary | ICD-10-CM | POA: Diagnosis not present

## 2019-01-06 MED ORDER — TERBINAFINE HCL 250 MG PO TABS: 250.0000 mg | ORAL_TABLET | Freq: Every day | ORAL | 0 refills | Status: DC

## 2019-01-06 NOTE — Patient Instructions (Signed)
Terbinafine tablets What is this medicine? TERBINAFINE (TER bin a feen) is an antifungal medicine. It is used to treat certain kinds of fungal or yeast infections. This medicine may be used for other purposes; ask your health care provider or pharmacist if you have questions. COMMON BRAND NAME(S): Lamisil, Terbinex What should I tell my health care provider before I take this medicine? They need to know if you have any of these conditions:  drink alcoholic beverages  kidney disease  liver disease  an unusual or allergic reaction to terbinafine, other medicines, foods, dyes, or preservatives  pregnant or trying to get pregnant  breast-feeding How should I use this medicine? Take this medicine by mouth with a full glass of water. Follow the directions on the prescription label. You can take this medicine with food or on an empty stomach. Take your medicine at regular intervals. Do not take your medicine more often than directed. Do not skip doses or stop your medicine early even if you feel better. Do not stop taking except on your doctor's advice. Talk to your pediatrician regarding the use of this medicine in children. Special care may be needed. Overdosage: If you think you have taken too much of this medicine contact a poison control center or emergency room at once. NOTE: This medicine is only for you. Do not share this medicine with others. What if I miss a dose? If you miss a dose, take it as soon as you can. If it is almost time for your next dose, take only that dose. Do not take double or extra doses. What may interact with this medicine? Do not take this medicine with any of the following medications:  thioridazine This medicine may also interact with the following medications:  beta-blockers  caffeine  cimetidine  cyclosporine  medicines for depression, anxiety, or psychotic disturbances  medicines for fungal infections like fluconazole and ketoconazole  medicines  for irregular heartbeat like amiodarone, flecainide and propafenone  rifampin  warfarin This list may not describe all possible interactions. Give your health care provider a list of all the medicines, herbs, non-prescription drugs, or dietary supplements you use. Also tell them if you smoke, drink alcohol, or use illegal drugs. Some items may interact with your medicine. What should I watch for while using this medicine? Visit your doctor or health care provider regularly. Tell your doctor right away if you have nausea or vomiting, loss of appetite, stomach pain on your right upper side, yellow skin, dark urine, light stools, or are over tired. Some fungal infections need many weeks or months of treatment to cure. If you are taking this medicine for a long time, you will need to have important blood work done. This medicine may cause serious skin reactions. They can happen weeks to months after starting the medicine. Contact your health care provider right away if you notice fevers or flu-like symptoms with a rash. The rash may be red or purple and then turn into blisters or peeling of the skin. Or, you might notice a red rash with swelling of the face, lips or lymph nodes in your neck or under your arms. What side effects may I notice from receiving this medicine? Side effects that you should report to your doctor or health care professional as soon as possible:  allergic reactions like skin rash or hives, swelling of the face, lips, or tongue  changes in vision  dark urine  fever or infection  general ill feeling or flu-like symptoms    light-colored stools  loss of appetite, nausea  rash, fever, and swollen lymph nodes  redness, blistering, peeling or loosening of the skin, including inside the mouth  right upper belly pain  unusually weak or tired  yellowing of the eyes or skin Side effects that usually do not require medical attention (report to your doctor or health care  professional if they continue or are bothersome):  changes in taste  diarrhea  hair loss  muscle or joint pain  stomach gas  stomach upset This list may not describe all possible side effects. Call your doctor for medical advice about side effects. You may report side effects to FDA at 1-800-FDA-1088. Where should I keep my medicine? Keep out of the reach of children. Store at room temperature below 25 degrees C (77 degrees F). Protect from light. Throw away any unused medicine after the expiration date. NOTE: This sheet is a summary. It may not cover all possible information. If you have questions about this medicine, talk to your doctor, pharmacist, or health care provider.  2020 Elsevier/Gold Standard (2018-06-06 15:37:07)  

## 2019-01-07 NOTE — Progress Notes (Signed)
He presents today for follow-up of his onychomycosis.  Objective: Vital signs are stable he is alert and oriented x3 no change in physical exam.  Pathology report does demonstrate dermatophyte.  Assessment: Onychomycosis.  Plan: Discussed etiology pathology conservative surgical therapies.  At this point after thorough discussion the pros and cons of oral therapy laser therapy and topical therapy he chose to utilize oral therapy.  I expressed to him that we will have to do 2 liver profiles he understands and is amenable to it.  He will have to take the medicine for at least 120 days.  Possibly longer.  Requisition was provided for blood work today consisting of the liver profile.  Should this come back abnormal we will notify him immediately otherwise we will go ahead and write him his first 30 tablets and follow-up with him in 1 month

## 2019-01-13 DIAGNOSIS — Z79899 Other long term (current) drug therapy: Secondary | ICD-10-CM | POA: Diagnosis not present

## 2019-01-14 ENCOUNTER — Telehealth: Payer: Self-pay | Admitting: *Deleted

## 2019-01-14 LAB — HEPATIC FUNCTION PANEL
AG Ratio: 1.5 (calc) (ref 1.0–2.5)
ALT: 17 U/L (ref 9–46)
AST: 19 U/L (ref 10–35)
Albumin: 4.6 g/dL (ref 3.6–5.1)
Alkaline phosphatase (APISO): 51 U/L (ref 35–144)
Bilirubin, Direct: 0.1 mg/dL (ref 0.0–0.2)
Globulin: 3 g/dL (calc) (ref 1.9–3.7)
Indirect Bilirubin: 0.6 mg/dL (calc) (ref 0.2–1.2)
Total Bilirubin: 0.7 mg/dL (ref 0.2–1.2)
Total Protein: 7.6 g/dL (ref 6.1–8.1)

## 2019-01-14 NOTE — Telephone Encounter (Signed)
I informed pt of Dr. Hyatt's review of results and orders. 

## 2019-01-14 NOTE — Telephone Encounter (Signed)
-----   Message from Garrel Ridgel, [place] sent at 01/14/2019  7:25 AM EST ----- Blood work looks perfect and may continue medication.

## 2019-01-26 ENCOUNTER — Telehealth: Payer: Self-pay | Admitting: Podiatry

## 2019-01-26 NOTE — Telephone Encounter (Signed)
I informed pt he should stop the lamisil and begin Benadryl to decrease his symptoms, but if he should have worsening symptoms, difficulty breathing or swallowing or swelling he should go to the ED. I told pt I would inform Dr. Milinda Pointer and call with further instructions.

## 2019-01-26 NOTE — Telephone Encounter (Signed)
Pt called states he has taken Lamisil and now has a body rash.

## 2019-01-26 NOTE — Telephone Encounter (Signed)
Patient called to say that he has had a allergic reaction to Lamisil wants to know if he needs to stop taking

## 2019-01-27 NOTE — Telephone Encounter (Signed)
Stop lamisil po and allow rash to clear the you can start him on sporanox 100mg  two po qd.

## 2019-01-27 NOTE — Telephone Encounter (Signed)
Left message informing pt of Dr. Stephenie Acres 01/27/2019 7:02am orders.

## 2019-02-03 ENCOUNTER — Other Ambulatory Visit: Payer: Self-pay

## 2019-02-03 ENCOUNTER — Ambulatory Visit (INDEPENDENT_AMBULATORY_CARE_PROVIDER_SITE_OTHER): Payer: BC Managed Care – PPO | Admitting: Podiatry

## 2019-02-03 DIAGNOSIS — L603 Nail dystrophy: Secondary | ICD-10-CM | POA: Diagnosis not present

## 2019-02-03 MED ORDER — FLUCONAZOLE 150 MG PO TABS: 300.0000 mg | ORAL_TABLET | ORAL | 0 refills | Status: DC

## 2019-02-03 NOTE — Progress Notes (Signed)
He presents today 1 month follow-up of his Lamisil therapy states that he developed a rash over his entire body.  He discontinued Lamisil with 7 to 10 pills to go before finishing the first dose.  He states that it did not itch but it was a enough of a rash that he discontinue the use and does not want to try those again.  Objective: Vital signs are stable he is alert and oriented x3 at this point he has little to no clearing of the nails.  He however feels that they appear to be thicker and less flaky.  Assessment: Onychomycosis per back pathology.  Plan: Started him on fluconazole 300 mg once weekly for the next several months we will follow-up with him in 3 months to make sure that he is growing out.

## 2019-02-13 ENCOUNTER — Telehealth: Payer: Self-pay | Admitting: Podiatry

## 2019-02-13 NOTE — Telephone Encounter (Signed)
[facility] will only okay 2 tablets in 25 day period, so PA should be completed for Diflucan 150mg  2 tablets once a week for 12 weeks #24.

## 2019-02-13 NOTE — Telephone Encounter (Signed)
Left message informing pt's wife, Maudie Mercury of the PA process for the Diflucan.

## 2019-02-13 NOTE — Telephone Encounter (Signed)
Pt was prescribed Diflucan and when he went to get the prescription the pharmacy would only give him 2 pills instead of his full prescription. Pts wife calling to see if we can get clarification to the pharmacy so that he can get the remainder of his medication.   Please give patient a call.

## 2019-02-18 NOTE — Telephone Encounter (Signed)
PA initiated 02/18/19

## 2019-02-25 ENCOUNTER — Telehealth: Payer: Self-pay | Admitting: *Deleted

## 2019-02-25 MED ORDER — FLUCONAZOLE 150 MG PO TABS: 300.0000 mg | ORAL_TABLET | ORAL | 0 refills | Status: DC

## 2019-02-25 NOTE — Telephone Encounter (Signed)
Sent Rx to [facility] at [facility] as requested by the patient.

## 2019-02-25 NOTE — Telephone Encounter (Signed)
Patient's insurance has denied coverage for fluconazole for onychomycosis. I contacted patient and informed him of this. I will talk to Dr. Milinda Pointer and see if there is an alternative medication we could try. Will let patient know.

## 2019-02-25 NOTE — Telephone Encounter (Signed)
Dr. Milinda Pointer said fluconazole would be best to treat his nail fungus. I contacted the local [facility] to check pricing on fluconazole self pay, she quoted #24 fluconazole tablets would be $35. I left message with the patient with this information and if he wants to proceed I would just need which [facility] he would like this called in to.

## 2019-06-04 ENCOUNTER — Other Ambulatory Visit: Payer: Self-pay

## 2019-06-04 ENCOUNTER — Ambulatory Visit (INDEPENDENT_AMBULATORY_CARE_PROVIDER_SITE_OTHER): Payer: BC Managed Care – PPO | Admitting: Podiatry

## 2019-06-04 DIAGNOSIS — L603 Nail dystrophy: Secondary | ICD-10-CM

## 2019-06-04 MED ORDER — FLUCONAZOLE 150 MG PO TABS: 300.0000 mg | ORAL_TABLET | ORAL | 0 refills | Status: DC

## 2019-06-04 NOTE — Progress Notes (Signed)
He presents today for follow-up of his fluconazole.  He denies fever chills nausea vomiting muscle aches pains calf pain back pain chest pain shortness of breath.  States he has had no rashes or itching.  States that his toenails are looking fantastic.  Objective: Vital signs are stable alert and oriented x3.  Pulses are palpable.  Nail plates appear to be clearing by about 85%.  Assessment: Well-healing onychomycosis long-term therapy secondary to fluconazole.  Plan: Refill his fluconazole for the next 3 months 2 tablets once a week each week.  I will follow-up with him in 4 months.  Should he have questions or concerns develop any rashes or itching he will notify us immediately.

## 2019-06-29 ENCOUNTER — Telehealth: Payer: Self-pay | Admitting: Podiatry

## 2019-06-29 MED ORDER — FLUCONAZOLE 150 MG PO TABS: 150.0000 mg | ORAL_TABLET | Freq: Once | ORAL | 1 refills | Status: AC

## 2019-06-29 NOTE — Telephone Encounter (Signed)
Patient called in and requested a refill of his Fluconazle 150 mg tabs.

## 2019-06-29 NOTE — Telephone Encounter (Signed)
I spoke with pt and he states his insurance would only cover 1 month at a time of the Fluconazole. I asked pt if his insurance would cover 8 tablets +1refill and pt states he thought they would.

## 2019-06-29 NOTE — Addendum Note (Signed)
Addended by: Harriett Sine D on: 06/29/2019 04:21 PM   Modules accepted: Orders

## 2019-09-08 ENCOUNTER — Ambulatory Visit: Payer: BC Managed Care – PPO | Admitting: Podiatry

## 2019-12-10 ENCOUNTER — Other Ambulatory Visit: Payer: Self-pay | Admitting: Family Medicine

## 2019-12-10 DIAGNOSIS — E7800 Pure hypercholesterolemia, unspecified: Secondary | ICD-10-CM | POA: Diagnosis not present

## 2019-12-10 DIAGNOSIS — E041 Nontoxic single thyroid nodule: Secondary | ICD-10-CM

## 2019-12-10 DIAGNOSIS — Z125 Encounter for screening for malignant neoplasm of prostate: Secondary | ICD-10-CM | POA: Diagnosis not present

## 2019-12-10 DIAGNOSIS — E039 Hypothyroidism, unspecified: Secondary | ICD-10-CM | POA: Diagnosis not present

## 2019-12-10 DIAGNOSIS — Z0000 Encounter for general adult medical examination without abnormal findings: Secondary | ICD-10-CM | POA: Diagnosis not present

## 2019-12-10 DIAGNOSIS — K219 Gastro-esophageal reflux disease without esophagitis: Secondary | ICD-10-CM | POA: Diagnosis not present

## 2019-12-10 DIAGNOSIS — Z23 Encounter for immunization: Secondary | ICD-10-CM | POA: Diagnosis not present

## 2019-12-15 ENCOUNTER — Other Ambulatory Visit: Payer: Self-pay | Admitting: Family Medicine

## 2019-12-15 DIAGNOSIS — E041 Nontoxic single thyroid nodule: Secondary | ICD-10-CM

## 2019-12-17 ENCOUNTER — Other Ambulatory Visit: Payer: BC Managed Care – PPO

## 2019-12-22 ENCOUNTER — Ambulatory Visit
Admission: RE | Admit: 2019-12-22 | Discharge: 2019-12-22 | Disposition: A | Discharge status: Home / Self Care | Payer: BC Managed Care – PPO | Source: Ambulatory Visit | Attending: Family Medicine | Admitting: Family Medicine

## 2019-12-22 DIAGNOSIS — E042 Nontoxic multinodular goiter: Secondary | ICD-10-CM | POA: Diagnosis not present

## 2019-12-22 DIAGNOSIS — E041 Nontoxic single thyroid nodule: Secondary | ICD-10-CM

## 2020-02-01 DIAGNOSIS — L821 Other seborrheic keratosis: Secondary | ICD-10-CM | POA: Diagnosis not present

## 2020-02-01 DIAGNOSIS — L309 Dermatitis, unspecified: Secondary | ICD-10-CM | POA: Diagnosis not present

## 2020-02-01 DIAGNOSIS — L281 Prurigo nodularis: Secondary | ICD-10-CM | POA: Diagnosis not present

## 2020-02-16 DIAGNOSIS — Z23 Encounter for immunization: Secondary | ICD-10-CM | POA: Diagnosis not present

## 2020-06-29 ENCOUNTER — Emergency Department (HOSPITAL_BASED_OUTPATIENT_CLINIC_OR_DEPARTMENT_OTHER): Payer: BC Managed Care – PPO

## 2020-06-29 ENCOUNTER — Emergency Department (HOSPITAL_BASED_OUTPATIENT_CLINIC_OR_DEPARTMENT_OTHER)
Admission: EM | Admit: 2020-06-29 | Discharge: 2020-06-29 | Disposition: A | Discharge status: Home / Self Care | Payer: BC Managed Care – PPO | Attending: Emergency Medicine | Admitting: Emergency Medicine

## 2020-06-29 ENCOUNTER — Other Ambulatory Visit: Payer: Self-pay

## 2020-06-29 ENCOUNTER — Encounter (HOSPITAL_BASED_OUTPATIENT_CLINIC_OR_DEPARTMENT_OTHER): Payer: Self-pay

## 2020-06-29 DIAGNOSIS — R42 Dizziness and giddiness: Secondary | ICD-10-CM

## 2020-06-29 DIAGNOSIS — R61 Generalized hyperhidrosis: Secondary | ICD-10-CM | POA: Diagnosis not present

## 2020-06-29 DIAGNOSIS — E039 Hypothyroidism, unspecified: Secondary | ICD-10-CM | POA: Insufficient documentation

## 2020-06-29 DIAGNOSIS — Z20822 Contact with and (suspected) exposure to covid-19: Secondary | ICD-10-CM | POA: Insufficient documentation

## 2020-06-29 DIAGNOSIS — Z79899 Other long term (current) drug therapy: Secondary | ICD-10-CM | POA: Diagnosis not present

## 2020-06-29 DIAGNOSIS — R55 Syncope and collapse: Secondary | ICD-10-CM | POA: Diagnosis not present

## 2020-06-29 LAB — BASIC METABOLIC PANEL
Anion gap: 11 (ref 5–15)
BUN: 12 mg/dL (ref 8–23)
CO2: 24 mmol/L (ref 22–32)
Calcium: 9.4 mg/dL (ref 8.9–10.3)
Chloride: 100 mmol/L (ref 98–111)
Creatinine, Ser: 1.02 mg/dL (ref 0.61–1.24)
GFR, Estimated: >60 mL/min (ref >60)
Glucose, Bld: 112 mg/dL — ABNORMAL HIGH (ref 70–99)
Potassium: 4.4 mmol/L (ref 3.5–5.1)
Sodium: 135 mmol/L (ref 135–145)

## 2020-06-29 LAB — CBC WITH DIFFERENTIAL/PLATELET
Abs Immature Granulocytes: 0.08 10*3/uL — ABNORMAL HIGH (ref 0.00–0.07)
Basophils Absolute: 0.1 10*3/uL (ref 0.0–0.1)
Basophils Relative: 0 %
Eosinophils Absolute: 0.1 10*3/uL (ref 0.0–0.5)
Eosinophils Relative: 1 %
HCT: 50.8 % (ref 39.0–52.0)
Hemoglobin: 16.9 g/dL (ref 13.0–17.0)
Immature Granulocytes: 1 %
Lymphocytes Relative: 5 %
Lymphs Abs: 0.9 10*3/uL (ref 0.7–4.0)
MCH: 31 pg (ref 26.0–34.0)
MCHC: 33.3 g/dL (ref 30.0–36.0)
MCV: 93.2 fL (ref 80.0–100.0)
Monocytes Absolute: 0.6 10*3/uL (ref 0.1–1.0)
Monocytes Relative: 4 %
Neutro Abs: 15.3 10*3/uL — ABNORMAL HIGH (ref 1.7–7.7)
Neutrophils Relative %: 89 %
Platelets: 199 10*3/uL (ref 150–400)
RBC: 5.45 MIL/uL (ref 4.22–5.81)
RDW: 13.5 % (ref 11.5–15.5)
WBC: 17.1 10*3/uL — ABNORMAL HIGH (ref 4.0–10.5)
nRBC: 0 % (ref 0.0–0.2)

## 2020-06-29 LAB — TROPONIN I (HIGH SENSITIVITY): Troponin I (High Sensitivity): 2 ng/L (ref <18)

## 2020-06-29 LAB — SARS CORONAVIRUS 2 (TAT 6-24 HRS): SARS Coronavirus 2: NEGATIVE

## 2020-06-29 MED ORDER — SODIUM CHLORIDE 0.9 % IV BOLUS
1000.0000 mL | Freq: Once | INTRAVENOUS | Status: AC
  Administered 2020-06-29: 1000 mL via INTRAVENOUS

## 2020-06-29 NOTE — ED Notes (Signed)
Pt reports feeling better now, resting comfortably, BP wnl.  Attempt at IV x2 with no result.  Pt tolerated well.

## 2020-06-29 NOTE — Discharge Instructions (Addendum)
Your lab work and imaging look reassuring today, I suspect you may have been dehydrated causing your symptoms.  Please stay hydrated and continue with your medication as prescribed.    You have a elevated  white counts, please follow-up with your primary care doctor for reevaluation.  Recommend a repeat CBC for further evaluation.  Come back to the emergency department if you develop chest pain, shortness of breath, severe abdominal pain, uncontrolled nausea, vomiting, diarrhea.

## 2020-06-29 NOTE — ED Triage Notes (Signed)
Pt was driving to ER for his wife when he became dizzy, diaphoretic and short of breath. Has improved since arrival, however feels "off". States felt like he was going to pass out.   States he has been having indigestion the past few days

## 2020-06-29 NOTE — ED Provider Notes (Signed)
[facility] EMERGENCY DEPARTMENT Provider Note   CSN: [id number] Arrival date & time: 06/29/20  1015     History Chief Complaint  Patient presents with  . Dizziness    Kenneth Leach is a 65 y.o. male.  HPI   Patient with significant medical history of hypothyroidism GERD presents to the emergency department with chief complaint of feeling dizziness, diaphoretic.  He endorsed that this started today while he is in the car driving his wife to the emergency department when he suddenly became lightheaded, dizzy, and become diaphoretic.  He had no associated chest pain or shortness of breath, denies nausea or vomiting, denies change in vision, paresthesias or weakness the upper or lower extremities.  This has all subsided but patient states he feels a little off at this time.  He has no significant cardiac history, no history of PEs or DVTs, not on hormone therapy, he has no connective tissue disorders like Marfan's disease or aneurysms, patient denies smoking history, does not drink much alcohol, denies illicit drug use.  He does endorse that he did not eat breakfast this morning and drink 2 cups of coffee.  He denies recent change in his Synthroid medications, denies ever experiencing this in the past.  Patient denies headaches, fevers, chills, shortness of breath, chest pain, abdominal pain, nausea, vomiting, diarrhea, worsening pedal edema.  Past Medical History:  Diagnosis Date  . Allergy   . GERD (gastroesophageal reflux disease)   . Hyperlipidemia   . Hypothyroidism   . Inguinal hernia    right  . Thyroid disease     Patient Active Problem List   Diagnosis Date Noted  . Hypothyroidism 05/03/2016  . Gastroesophageal reflux disease 04/06/2015  . Hyperlipidemia 04/06/2015  . Multiple contusions 08/23/2014  . Thigh hematoma 08/23/2014  . Acute blood loss anemia 08/23/2014  . Ileus ([place]) 08/20/2014  . Fall from ladder 08/19/2014  . Sternal fracture 08/19/2014  .  Right rib fracture 08/19/2014  . Mediastinal hematoma 08/18/2014    Past Surgical History:  Procedure Laterality Date  . COLONOSCOPY     had 2 normal colons per pt  . INGUINAL HERNIA REPAIR Right 12/06/2015   Procedure: HERNIA REPAIR INGUINAL ADULT;  Surgeon: Judeth Horn, MD;  Location: [place];  Service: General;  Laterality: Right;  . INSERTION OF MESH Right 12/06/2015   Procedure: INSERTION OF MESH;  Surgeon: Judeth Horn, MD;  Location: [place];  Service: General;  Laterality: Right;  . LAPAROSCOPIC ABDOMINAL EXPLORATION N/A 12/06/2015   Procedure: LAPAROSCOPIC ABDOMINAL EXPLORATION;  Surgeon: Judeth Horn, MD;  [facility];  Service: General;  Laterality: N/A;       Family History  Problem Relation Age of Onset  . Colon cancer Father   . Colon cancer Paternal Uncle   . Colon polyps Neg Hx   . Rectal cancer Neg Hx   . Stomach cancer Neg Hx     Social History   Tobacco Use  . Smoking status: Never Smoker  . Smokeless tobacco: Never Used  Substance Use Topics  . Alcohol use: Yes    Comment: socially  . Drug use: No    Home Medications Prior to Admission medications   Medication Sig Start Date End Date Taking? Authorizing Provider  omeprazole (PRILOSEC) 20 MG capsule Take 20 mg by mouth daily.   Yes [provider]  simvastatin (ZOCOR) 40 MG tablet Take 40 mg by mouth daily. 06/01/14  Yes [provider]  SYNTHROID 100 MCG tablet  Take 100 mcg by mouth daily. 05/31/14  Yes [provider]  Docusate Sodium (DSS) 100 MG CAPS docusate sodium 100 mg capsule  TAKE 1 CAPSULE BY MOUTH TWICE A DAY    [provider]  fluconazole (DIFLUCAN) 150 MG tablet Take 2 tablets (300 mg total) by mouth once a week. 06/04/19   Hyatt, Max T, DPM  oxyCODONE (OXY IR/ROXICODONE) 5 MG immediate release tablet oxycodone 5 mg tablet    [provider]  polyethylene glycol powder (GLYCOLAX/MIRALAX) 17 GM/SCOOP powder polyethylene glycol 3350 17 gram/dose oral  powder    [provider]    Allergies    Lamisil [terbinafine hcl]  Review of Systems   Review of Systems  Constitutional: Positive for diaphoresis. Negative for chills and fever.  HENT: Negative for congestion and sore throat.   Respiratory: Negative for shortness of breath.   Cardiovascular: Negative for chest pain.  Gastrointestinal: Negative for abdominal pain, diarrhea, nausea and vomiting.  Genitourinary: Negative for enuresis.  Musculoskeletal: Negative for back pain.  Skin: Negative for rash.  Neurological: Positive for dizziness.  Hematological: Does not bruise/bleed easily.    Physical Exam Updated Vital Signs BP 127/83   Pulse 66   Temp 98 F (36.7 C) (Oral)   Resp (!) 22   Ht 5\' 11"  (1.803 m)   Wt 99.8 kg   SpO2 96%   BMI 30.68 kg/m   Physical Exam Vitals and nursing note reviewed.  Constitutional:      General: He is not in acute distress.    Appearance: He is not ill-appearing.  HENT:     Head: Normocephalic and atraumatic.     Nose: No congestion.  Eyes:     Extraocular Movements: Extraocular movements intact.     Conjunctiva/sclera: Conjunctivae normal.     Pupils: Pupils are equal, round, and reactive to light.  Neck:     Vascular: No carotid bruit.     Comments: Neck was palpated there is no palpable mass or nodules, thyroid appears to be within normal limits Cardiovascular:     Rate and Rhythm: Normal rate and regular rhythm.     Pulses: Normal pulses.     Heart sounds: No murmur heard. No friction rub. No gallop.   Pulmonary:     Effort: No respiratory distress.     Breath sounds: No wheezing, rhonchi or rales.  Abdominal:     Palpations: Abdomen is soft.     Tenderness: There is no abdominal tenderness. There is no right CVA tenderness or left CVA tenderness.  Musculoskeletal:     Cervical back: Normal range of motion.     Right lower leg: No edema.     Left lower leg: No edema.  Skin:    General: Skin is warm and dry.   Neurological:     Mental Status: He is alert.     GCS: GCS eye subscore is 4. GCS verbal subscore is 5. GCS motor subscore is 6.     Cranial Nerves: No facial asymmetry.     Sensory: Sensation is intact.     Motor: No weakness.     Coordination: Romberg sign negative. Finger-Nose-Finger Test normal.     Comments: Cranial nerves II through XII grossly intact  Patient had no difficulty word finding.  Psychiatric:        Mood and Affect: Mood normal.     ED Results / Procedures / Treatments   Labs (all labs ordered are listed, but only abnormal  results are displayed) Labs Reviewed  BASIC METABOLIC PANEL - Abnormal; Notable for the following components:      Result Value   Glucose, Bld 112 (*)    All other components within normal limits  CBC WITH DIFFERENTIAL/PLATELET - Abnormal; Notable for the following components:   WBC 17.1 (*)    Neutro Abs 15.3 (*)    Abs Immature Granulocytes 0.08 (*)    All other components within normal limits  SARS CORONAVIRUS 2 (TAT 6-24 HRS)  TROPONIN I (HIGH SENSITIVITY)    EKG EKG Interpretation  Date/Time:  Wednesday June 29 2020 10:35:10 EDT Ventricular Rate:  68 PR Interval:  157 QRS Duration: 90 QT Interval:  408 QTC Calculation: 434 R Axis:   211 Text Interpretation: Sinus rhythm Markedly posterior QRS axis Low voltage, precordial leads Consider anterior infarct Confirmed by Madalyn Rob [phone]) on 06/29/2020 11:05:50 AM   Radiology DG Chest Port 1 View  Result Date: 06/29/2020 CLINICAL DATA:  Near syncopal episode. EXAM: PORTABLE CHEST 1 VIEW COMPARISON:  11/29/2015 FINDINGS: The cardiac silhouette, mediastinal and hilar contours are within normal limits given the AP projection and portable technique. No acute pulmonary findings. No pleural effusions or pulmonary lesions. The bony thorax is intact. IMPRESSION: No acute cardiopulmonary findings. Electronically Signed   By: Marijo Sanes M.D.   On: 06/29/2020 11:20     Procedures Procedures   Medications Ordered in ED Medications  sodium chloride 0.9 % bolus 1,000 mL (0 mLs Intravenous Stopped 06/29/20 1322)    ED Course  I have reviewed the triage vital signs and the nursing notes.  Pertinent labs & imaging results that were available during my care of the patient were reviewed by me and considered in my medical decision making (see chart for details).    MDM Rules/Calculators/A&P                         Initial impression-patient presents with dizziness and lightheadedness.  This has all resolved, patient is alert, does not appear to be in acute distress, vital signs reassuring.  Will obtain chest pain work-up, EKG, chest x-ray, orthostatics and reassess  Work-up-CBC shows leukocytosis of 17.1, BMP shows hyperglycemia of 112, troponin 2, respiratory panel pending this time.  Chest x-ray negative for acute findings.  EKG sinus without signs of ischemia.  Patient had positive orthostatics  Reassessment patient with positive orthostatics, suspect patient he may had a vasovagal hypotension, will provide patient with fluids and reassess.  Patient was reassessed at providing the fluids, has a complaints this time, vital signs have remained stable.  Updated patient on lab and imaging patient agreed for discharge at this time.  Rule out- I have low suspicion for CVA or intracranial head bleed as there is no neurodeficits on my exam.  I have low suspicion for ACS as history is atypical, patient has no cardiac history, EKG was sinus rhythm without signs of ischemia, first troponin was 2.  Will defer second troponin as patient has been chest pain-free for greater than 12 hours, if ACS was present would expect elevation troponin at this time.  Low suspicion for PE as patient denies pleuritic chest pain, shortness of breath, patient denies leg pain, no pedal edema noted on exam, vital signs reassuring patient has no risk factors. low suspicion for AAA or aortic  dissection as history is atypical, patient has low risk factors.  Low suspicion for systemic infection as patient is nontoxic-appearing, vital signs reassuring,  no obvious source infection noted on exam.  Patient does have elevated white count but I suspect this may be more a acute phase reactant, will have him follow-up with PCP for repeat CBC.   Plan-dizziness which is since resolved- I suspect  is secondary due to dehydration, will recommend he continue stay hydrated, continue his home medications, follow with PCP for reevaluation.  Vital signs have remained stable, no indication for hospital admission.  Patient discussed with attending and they agreed with assessment and plan.  Patient given at home care as well strict return precautions.  Patient verbalized that they understood agreed to said plan.   Final Clinical Impression(s) / ED Diagnoses Final diagnoses:  Dizziness    Rx / DC Orders ED Discharge Orders    None       Marcello Fennel, PA-C 06/29/20 1325    Lucrezia Starch, MD 06/30/20 1400

## 2020-06-29 NOTE — ED Notes (Signed)
Pt on monitor and vitals cycling 

## 2020-08-09 DIAGNOSIS — J029 Acute pharyngitis, unspecified: Secondary | ICD-10-CM | POA: Diagnosis not present

## 2020-08-09 DIAGNOSIS — J40 Bronchitis, not specified as acute or chronic: Secondary | ICD-10-CM | POA: Diagnosis not present

## 2020-08-09 DIAGNOSIS — R059 Cough, unspecified: Secondary | ICD-10-CM | POA: Diagnosis not present

## 2020-08-09 DIAGNOSIS — J329 Chronic sinusitis, unspecified: Secondary | ICD-10-CM | POA: Diagnosis not present

## 2020-11-22 ENCOUNTER — Encounter: Payer: Self-pay | Admitting: Gastroenterology

## 2020-12-06 DIAGNOSIS — U071 COVID-19: Secondary | ICD-10-CM | POA: Diagnosis not present

## 2020-12-23 DIAGNOSIS — K219 Gastro-esophageal reflux disease without esophagitis: Secondary | ICD-10-CM | POA: Diagnosis not present

## 2020-12-23 DIAGNOSIS — E039 Hypothyroidism, unspecified: Secondary | ICD-10-CM | POA: Diagnosis not present

## 2020-12-23 DIAGNOSIS — Z125 Encounter for screening for malignant neoplasm of prostate: Secondary | ICD-10-CM | POA: Diagnosis not present

## 2020-12-23 DIAGNOSIS — Z0000 Encounter for general adult medical examination without abnormal findings: Secondary | ICD-10-CM | POA: Diagnosis not present

## 2020-12-23 DIAGNOSIS — Z23 Encounter for immunization: Secondary | ICD-10-CM | POA: Diagnosis not present

## 2020-12-23 DIAGNOSIS — E7800 Pure hypercholesterolemia, unspecified: Secondary | ICD-10-CM | POA: Diagnosis not present

## 2021-01-16 DIAGNOSIS — H9319 Tinnitus, unspecified ear: Secondary | ICD-10-CM | POA: Diagnosis not present

## 2021-01-16 DIAGNOSIS — H6980 Other specified disorders of Eustachian tube, unspecified ear: Secondary | ICD-10-CM | POA: Diagnosis not present

## 2021-01-16 DIAGNOSIS — H9190 Unspecified hearing loss, unspecified ear: Secondary | ICD-10-CM | POA: Diagnosis not present

## 2021-01-16 DIAGNOSIS — B001 Herpesviral vesicular dermatitis: Secondary | ICD-10-CM | POA: Diagnosis not present

## 2021-01-20 DIAGNOSIS — H9042 Sensorineural hearing loss, unilateral, left ear, with unrestricted hearing on the contralateral side: Secondary | ICD-10-CM | POA: Diagnosis not present

## 2021-01-20 DIAGNOSIS — J343 Hypertrophy of nasal turbinates: Secondary | ICD-10-CM | POA: Diagnosis not present

## 2021-02-20 DIAGNOSIS — H9042 Sensorineural hearing loss, unilateral, left ear, with unrestricted hearing on the contralateral side: Secondary | ICD-10-CM | POA: Diagnosis not present

## 2021-02-22 DIAGNOSIS — E039 Hypothyroidism, unspecified: Secondary | ICD-10-CM | POA: Diagnosis not present

## 2021-03-02 DIAGNOSIS — H9042 Sensorineural hearing loss, unilateral, left ear, with unrestricted hearing on the contralateral side: Secondary | ICD-10-CM | POA: Diagnosis not present

## 2021-03-02 DIAGNOSIS — H905 Unspecified sensorineural hearing loss: Secondary | ICD-10-CM | POA: Diagnosis not present

## 2021-03-02 DIAGNOSIS — H9190 Unspecified hearing loss, unspecified ear: Secondary | ICD-10-CM | POA: Diagnosis not present

## 2021-05-02 DIAGNOSIS — M25512 Pain in left shoulder: Secondary | ICD-10-CM | POA: Diagnosis not present

## 2021-05-08 ENCOUNTER — Other Ambulatory Visit: Payer: Self-pay | Admitting: Sports Medicine

## 2021-05-08 ENCOUNTER — Ambulatory Visit
Admission: RE | Admit: 2021-05-08 | Discharge: 2021-05-08 | Disposition: A | Discharge status: Home / Self Care | Payer: BC Managed Care – PPO | Source: Ambulatory Visit | Attending: Sports Medicine | Admitting: Sports Medicine

## 2021-05-08 DIAGNOSIS — M25512 Pain in left shoulder: Secondary | ICD-10-CM | POA: Diagnosis not present

## 2021-05-08 DIAGNOSIS — R52 Pain, unspecified: Secondary | ICD-10-CM

## 2021-06-05 DIAGNOSIS — M25512 Pain in left shoulder: Secondary | ICD-10-CM | POA: Diagnosis not present

## 2021-06-23 DIAGNOSIS — E7800 Pure hypercholesterolemia, unspecified: Secondary | ICD-10-CM | POA: Diagnosis not present

## 2021-07-17 DIAGNOSIS — M25512 Pain in left shoulder: Secondary | ICD-10-CM | POA: Diagnosis not present

## 2022-01-18 ENCOUNTER — Encounter: Payer: Self-pay | Admitting: Gastroenterology

## 2022-01-18 DIAGNOSIS — Z23 Encounter for immunization: Secondary | ICD-10-CM | POA: Diagnosis not present

## 2022-01-18 DIAGNOSIS — Z1159 Encounter for screening for other viral diseases: Secondary | ICD-10-CM | POA: Diagnosis not present

## 2022-01-18 DIAGNOSIS — J3089 Other allergic rhinitis: Secondary | ICD-10-CM | POA: Diagnosis not present

## 2022-01-18 DIAGNOSIS — E890 Postprocedural hypothyroidism: Secondary | ICD-10-CM | POA: Diagnosis not present

## 2022-01-18 DIAGNOSIS — E782 Mixed hyperlipidemia: Secondary | ICD-10-CM | POA: Diagnosis not present

## 2022-01-18 DIAGNOSIS — K219 Gastro-esophageal reflux disease without esophagitis: Secondary | ICD-10-CM | POA: Diagnosis not present

## 2022-02-09 ENCOUNTER — Ambulatory Visit (AMBULATORY_SURGERY_CENTER): Payer: BC Managed Care – PPO | Admitting: *Deleted

## 2022-02-09 VITALS — Ht 71.0 in | Wt 222.0 lb

## 2022-02-09 DIAGNOSIS — Z800 Family history of malignant neoplasm of digestive organs: Secondary | ICD-10-CM

## 2022-02-09 DIAGNOSIS — Z86010 Personal history of colonic polyps: Secondary | ICD-10-CM

## 2022-02-09 MED ORDER — NA SULFATE-K SULFATE-MG SULF 17.5-3.13-1.6 GM/177ML PO SOLN: 1.0000 | Freq: Once | ORAL | 0 refills | Status: AC

## 2022-02-09 NOTE — Progress Notes (Signed)

## 2022-02-23 ENCOUNTER — Encounter: Payer: Self-pay | Admitting: Gastroenterology

## 2022-02-27 ENCOUNTER — Ambulatory Visit (AMBULATORY_SURGERY_CENTER): Payer: BC Managed Care – PPO | Admitting: Gastroenterology

## 2022-02-27 ENCOUNTER — Encounter: Payer: Self-pay | Admitting: Gastroenterology

## 2022-02-27 VITALS — BP 107/70 | HR 52 | Temp 98.4°F | Resp 14 | Ht 72.0 in | Wt 222.0 lb

## 2022-02-27 DIAGNOSIS — Z86010 Personal history of colonic polyps: Secondary | ICD-10-CM

## 2022-02-27 DIAGNOSIS — Z800 Family history of malignant neoplasm of digestive organs: Secondary | ICD-10-CM

## 2022-02-27 DIAGNOSIS — Z09 Encounter for follow-up examination after completed treatment for conditions other than malignant neoplasm: Secondary | ICD-10-CM | POA: Diagnosis not present

## 2022-02-27 DIAGNOSIS — Z1211 Encounter for screening for malignant neoplasm of colon: Secondary | ICD-10-CM | POA: Diagnosis not present

## 2022-02-27 MED ORDER — SODIUM CHLORIDE 0.9 % IV SOLN: 500.0000 mL | Freq: Once | INTRAVENOUS | Status: AC

## 2022-02-27 NOTE — Progress Notes (Signed)
Pt's states no medical or surgical changes since previsit or office visit. 

## 2022-02-27 NOTE — Op Note (Signed)
Kenneth Leach Patient Name: Kenneth Leach Procedure Date: 02/27/2022 11:28 AM MRN: [id number] Endoscopist: Ladene Artist , MD, [phone] Age: 66 Referring MD:  Date of Birth: 1956/01/14 Gender: Male Account #: [id number] Procedure:                Colonoscopy Indications:              Surveillance: Personal history of adenomatous                            polyps on last colonoscopy > 5 years ago. Family                            history of colon cancer, 1st-degree relative. Medicines:                Monitored Anesthesia Care Procedure:                Pre-Anesthesia Assessment:                           - Prior to the procedure, a History and Physical                            was performed, and patient medications and                            allergies were reviewed. The patient's tolerance of                            previous anesthesia was also reviewed. The risks                            and benefits of the procedure and the sedation                            options and risks were discussed with the patient.                            All questions were answered, and informed consent                            was obtained. Prior Anticoagulants: The patient has                            taken no anticoagulant or antiplatelet agents. ASA                            Grade Assessment: II - A patient with mild systemic                            disease. After reviewing the risks and benefits,                            the patient was deemed in satisfactory condition to  undergo the procedure.                           After obtaining informed consent, the colonoscope                            was passed under direct vision. Throughout the                            procedure, the patient's blood pressure, pulse, and                            oxygen saturations were monitored continuously. The                            Olympus  CF-HQ190L (Serial# [id number]) Colonoscope was                            introduced through the anus and advanced to the the                            cecum, identified by appendiceal orifice and                            ileocecal valve. The ileocecal valve, appendiceal                            orifice, and rectum were photographed. The quality                            of the bowel preparation was good. The colonoscopy                            was performed without difficulty. The patient                            tolerated the procedure well. Scope In: 11:37:47 AM Scope Out: 11:50:40 AM Scope Withdrawal Time: 0 hours 10 minutes 24 seconds  Total Procedure Duration: 0 hours 12 minutes 53 seconds  Findings:                 The perianal and digital rectal examinations were                            normal.                           Internal hemorrhoids were found during                            retroflexion. The hemorrhoids were moderate and                            Grade I (internal hemorrhoids that do not prolapse).  The exam was otherwise without abnormality on                            direct and retroflexion views. Complications:            No immediate complications. Estimated blood loss:                            None. Estimated Blood Loss:     Estimated blood loss: none. Impression:               - Internal hemorrhoids.                           - The examination was otherwise normal on direct                            and retroflexion views.                           - No specimens collected. Recommendation:           - Repeat colonoscopy in 10 years for surveillance.                           - Patient has a contact number available for                            emergencies. The signs and symptoms of potential                            delayed complications were discussed with the                            patient. Return to normal  activities tomorrow.                            Written discharge instructions were provided to the                            patient.                           - Resume previous diet.                           - Continue present medications. Ladene Artist, MD 02/27/2022 12:03:11 PM This report has been signed electronically.

## 2022-02-27 NOTE — Progress Notes (Signed)
Sedate, gd SR, tolerated procedure well, VSS, report to RN 

## 2022-02-27 NOTE — Progress Notes (Signed)
History & Physical  Primary Care Physician:  Marda Stalker, PA-C Primary Gastroenterologist: Lucio Edward, MD  CHIEF COMPLAINT:  Personal history of colon polyps, [facility]  HPI: Kenneth Leach is a 66 y.o. male with a personal history of adenomatous colon polyps and family history of colon cancer, first-degree relative, for surveillance colonoscopy   Past Medical History:  Diagnosis Date   Allergy    SEASONAL   GERD (gastroesophageal reflux disease)    Hyperlipidemia    Hypothyroidism    Inguinal hernia    right   Thyroid disease     Past Surgical History:  Procedure Laterality Date   COLONOSCOPY     had 2 normal colons per pt   INGUINAL HERNIA REPAIR Right 12/06/2015   Procedure: HERNIA REPAIR INGUINAL ADULT;  Surgeon: Judeth Horn, MD;  Location: [place];  Service: General;  Laterality: Right;   INSERTION OF MESH Right 12/06/2015   Procedure: INSERTION OF MESH;  Surgeon: Judeth Horn, MD;  Location: [place];  Service: General;  Laterality: Right;   LAPAROSCOPIC ABDOMINAL EXPLORATION N/A 12/06/2015   Procedure: LAPAROSCOPIC ABDOMINAL EXPLORATION;  Surgeon: Judeth Horn, MD;  Location: [place];  Service: General;  Laterality: N/A;    Prior to Admission medications   Medication Sig Start Date End Date Taking? Authorizing Provider  famotidine (PEPCID) 20 MG tablet Take by mouth. 01/18/22  Yes [provider]  rosuvastatin (CRESTOR) 10 MG tablet Take by mouth. 01/18/22  Yes [provider]  SYNTHROID 100 MCG tablet Take 100 mcg by mouth daily. 05/31/14  Yes [provider]    Current Outpatient Medications  Medication Sig Dispense Refill   famotidine (PEPCID) 20 MG tablet Take by mouth.     rosuvastatin (CRESTOR) 10 MG tablet Take by mouth.     SYNTHROID 100 MCG tablet Take 100 mcg by mouth daily.     Current Facility-Administered Medications  Medication Dose Route Frequency Provider Last Rate Last Admin   0.9 %  sodium chloride infusion  500 mL  Intravenous Once Ladene Artist, MD        Allergies as of 02/27/2022 - Review Complete 02/27/2022  Allergen Reaction Noted   Lamisil [terbinafine hcl]  01/27/2019    Family History  Problem Relation Age of Onset   Colon cancer Father    Colon cancer Paternal Uncle    Colon polyps Neg Hx    Rectal cancer Neg Hx    Stomach cancer Neg Hx    Crohn's disease Neg Hx    Esophageal cancer Neg Hx    Ulcerative colitis Neg Hx     Social History   Socioeconomic History   Marital status: Married    Spouse name: Not on file   Number of children: Not on file   Years of education: Not on file   Highest education level: Not on file  Occupational History   Not on file  Tobacco Use   Smoking status: Never    Passive exposure: Never   Smokeless tobacco: Never  Vaping Use   Vaping Use: Never used  Substance and Sexual Activity   Alcohol use: Yes    Comment: socially   Drug use: No   Sexual activity: Not on file  Other Topics Concern   Not on file  Social History Narrative   Not on file   Social Determinants of Health   Financial Resource Strain: Not on file  Food Insecurity: Not on file  Transportation Needs: Not on file  Physical Activity: Not on file  Stress: Not on file  Social Connections: Not on file  Intimate Partner Violence: Not on file    Review of Systems:  All systems reviewed were negative except where noted in HPI.   Physical Exam: General:  Alert, well-developed, in NAD Head:  Normocephalic and atraumatic. Eyes:  Sclera clear, no icterus.   Conjunctiva pink. Ears:  Normal auditory acuity. Mouth:  No deformity or lesions.  Neck:  Supple; no masses . Lungs:  Clear throughout to auscultation.   No wheezes, crackles, or rhonchi. No acute distress. Heart:  Regular rate and rhythm; no murmurs. Abdomen:  Soft, nondistended, nontender. No masses, hepatomegaly. No obvious masses.  Normal bowel .    Rectal:  Deferred   Msk:  Symmetrical without gross  deformities.. Pulses:  Normal pulses noted. Extremities:  Without edema. Neurologic:  Alert and  oriented x4;  grossly normal neurologically. Skin:  Intact without significant lesions or rashes. Psych:  Alert and cooperative. Normal mood and affect.   Impression / Plan:   Personal history of adenomatous colon polyps and family history of colon cancer, first-degree relative, for surveillance colonoscopy.  Pricilla Riffle. Fuller Plan  02/27/2022, 11:26 AM See Shea Evans, [facility], to contact our on call provider

## 2022-02-27 NOTE — Patient Instructions (Addendum)
  Resume previous diet, continue present medications.  YOU HAD AN ENDOSCOPIC PROCEDURE TODAY AT [facility]:   Refer to the procedure report that was given to you for any specific questions about what was found during the examination.  If the procedure report does not answer your questions, please call your gastroenterologist to clarify.  If you requested that your care partner not be given the details of your procedure findings, then the procedure report has been included in a sealed envelope for you to review at your convenience later.  YOU SHOULD EXPECT: Some feelings of bloating in the abdomen. Passage of more gas than usual.  Walking can help get rid of the air that was put into your GI tract during the procedure and reduce the bloating. If you had a lower endoscopy (such as a colonoscopy or flexible sigmoidoscopy) you may notice spotting of blood in your stool or on the toilet paper. If you underwent a bowel prep for your procedure, you may not have a normal bowel movement for a few days.  Please Note:  You might notice some irritation and congestion in your nose or some drainage.  This is from the oxygen used during your procedure.  There is no need for concern and it should clear up in a day or so.  SYMPTOMS TO REPORT IMMEDIATELY:  Following lower endoscopy (colonoscopy or flexible sigmoidoscopy):  Excessive amounts of blood in the stool  Significant tenderness or worsening of abdominal pains  Swelling of the abdomen that is new, acute  Fever of 100F or higher  For urgent or emergent issues, a gastroenterologist can be reached at any hour by calling [phone]. Do not use MyChart messaging for urgent concerns.    DIET:  We do recommend a small meal at first, but then you may proceed to your regular diet.  Drink plenty of fluids but you should avoid alcoholic beverages for 24 hours.  ACTIVITY:  You should plan to take it easy for the rest of today and you should NOT  DRIVE or use heavy machinery until tomorrow (because of the sedation medicines used during the test).    FOLLOW UP: Our staff will call the number listed on your records the next business day following your procedure.  We will call around 7:15- 8:00 am to check on you and address any questions or concerns that you may have regarding the information given to you following your procedure. If we do not reach you, we will leave a message.     If any biopsies were taken you will be contacted by phone or by letter within the next 1-3 weeks.  Please call us at [phone] if you have not heard about the biopsies in 3 weeks.    SIGNATURES/CONFIDENTIALITY: You and/or your care partner have signed paperwork which will be entered into your electronic medical record.  These signatures attest to the fact that that the information above on your After Visit Summary has been reviewed and is understood.  Full responsibility of the confidentiality of this discharge information lies with you and/or your care-partner.

## 2022-02-28 ENCOUNTER — Telehealth: Payer: Self-pay

## 2022-02-28 NOTE — Telephone Encounter (Signed)
No answer, left message to call if having any issues or concerns, B.Kagan Mutchler RN 

## 2023-08-19 IMAGING — CR DG SHOULDER 2+V*L*
3 series · 3 of 3 positions shown · non-contrast
Comparison: None.

CLINICAL DATA: Left shoulder pain, progressively worsening.

EXAM:
LEFT SHOULDER - 2+ VIEW

[w shoulder ap internal left]
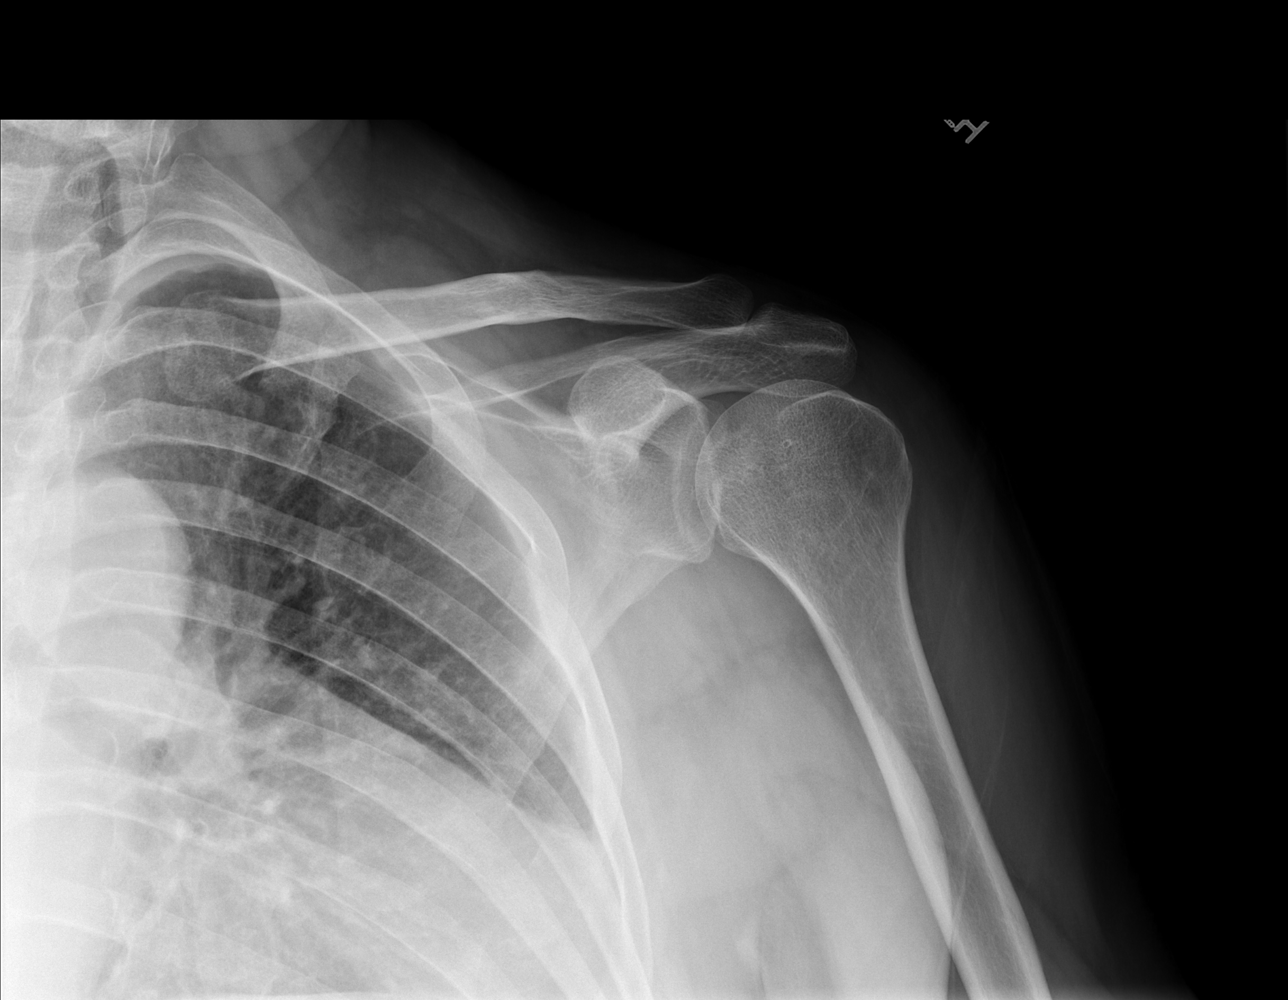

[w shoulder y view left]
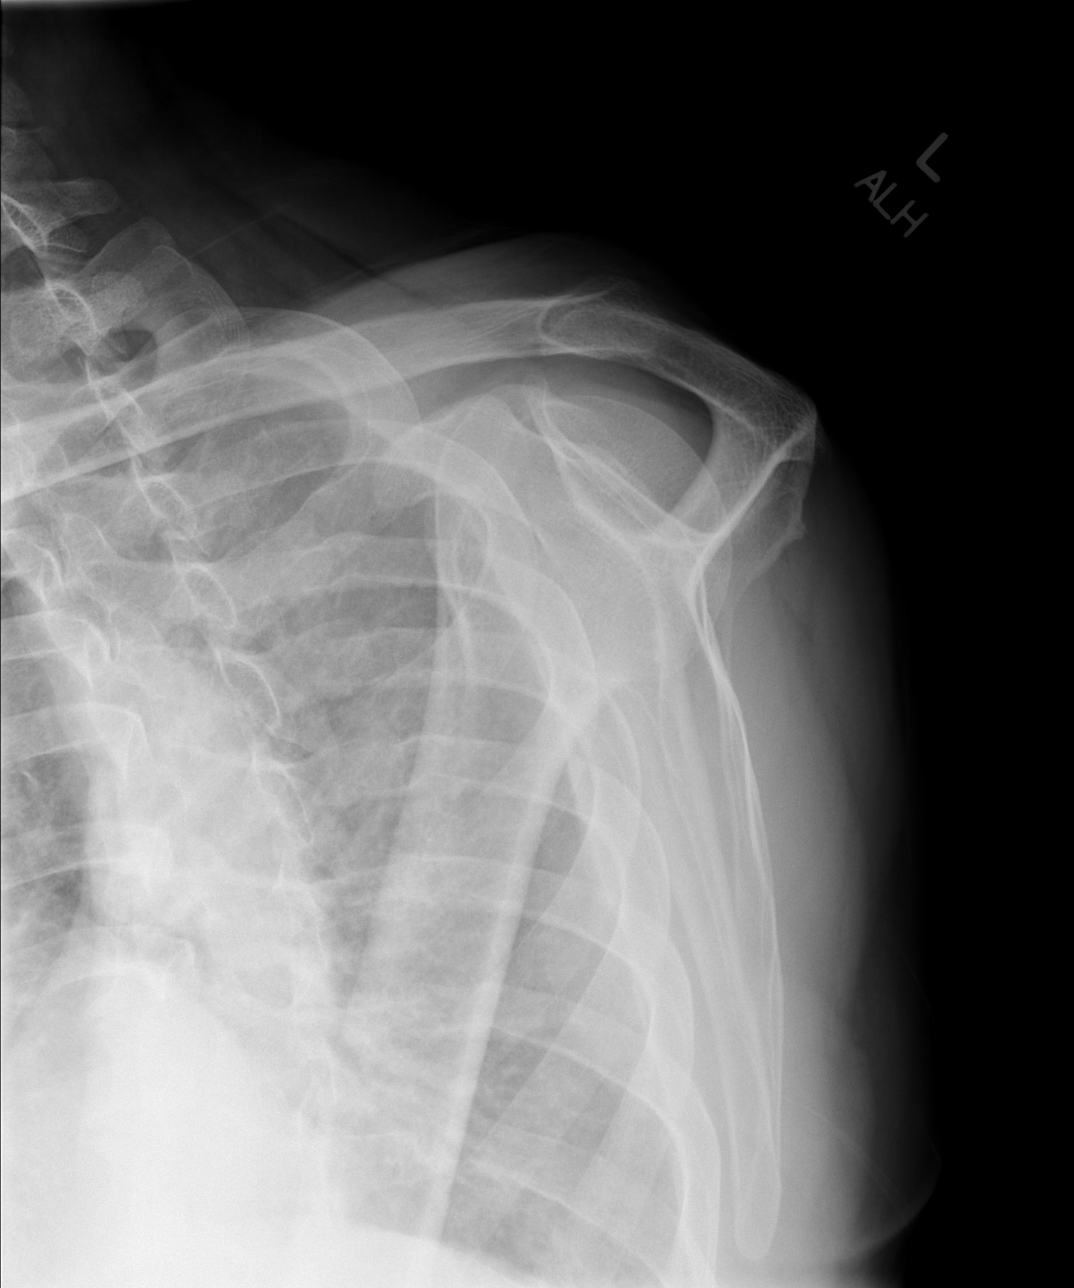

[w shoulder axillary left *]
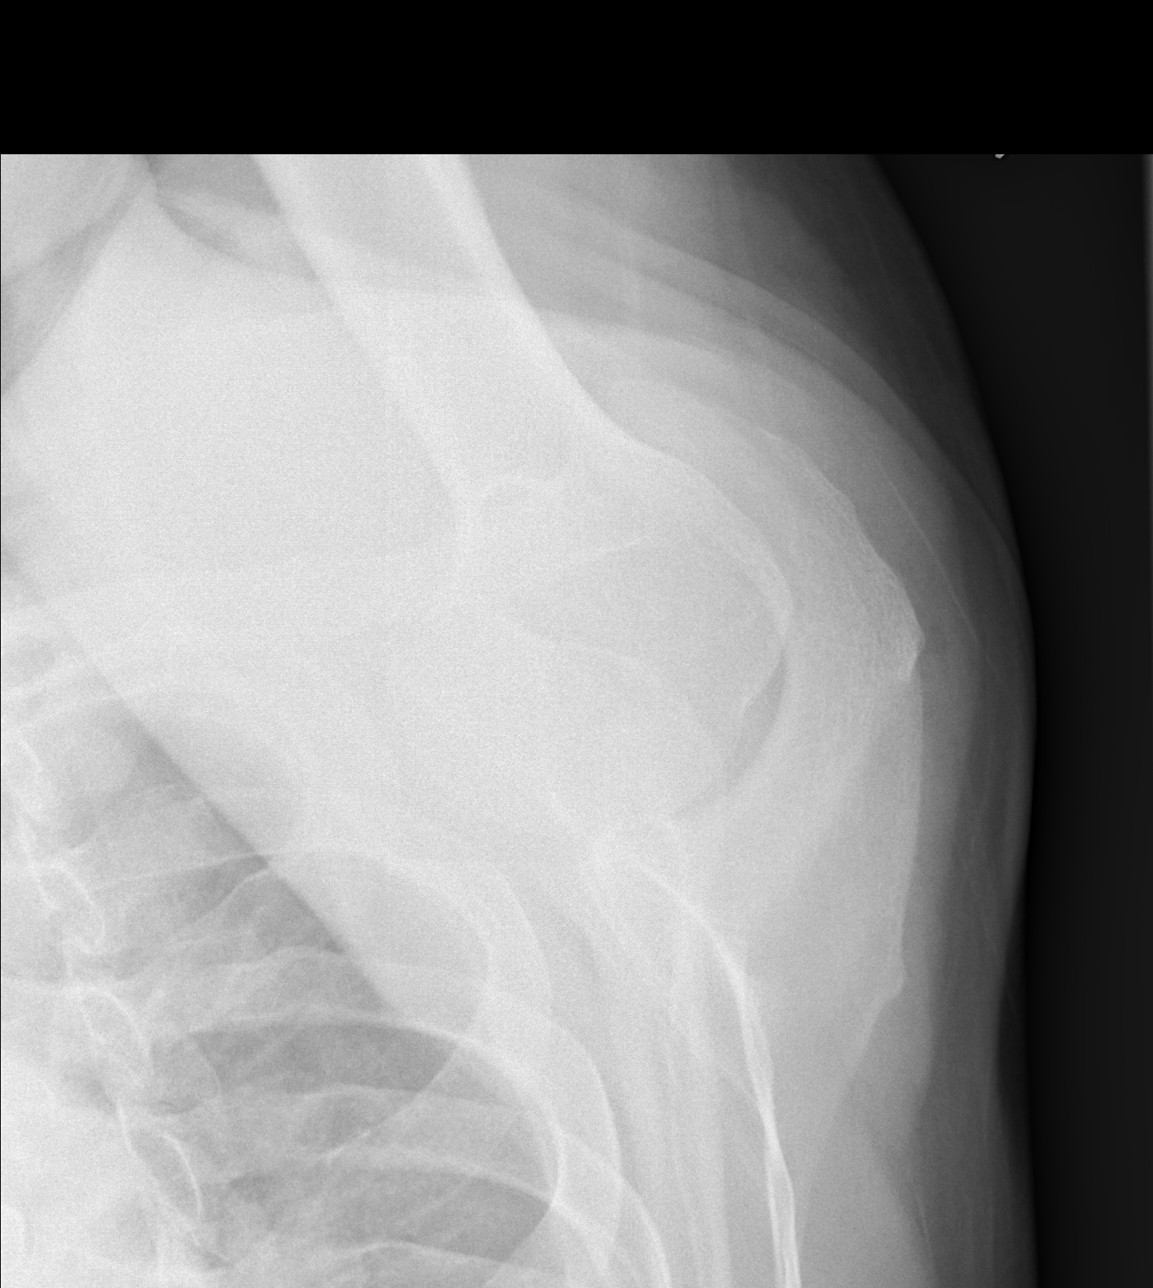

[3 of 3 positions shown; findings below may reference images not displayed]

FINDINGS: Normal bone mineralization. Mild acromioclavicular joint space
narrowing. No acute fracture or dislocation. The visualized portion
of the left lung is unremarkable.
IMPRESSION: Mild acromioclavicular joint space narrowing.
